# Patient Record
Sex: Male | Born: 1995 | Race: Black or African American | Hispanic: No | Marital: Single | Smoking: Never smoker
Health system: Southern US, Community
[De-identification: ages and names within clinical notes are randomized; demographics above are authoritative.]

## PROBLEM LIST (undated history)

## (undated) DIAGNOSIS — R220 Localized swelling, mass and lump, head: Secondary | ICD-10-CM

## (undated) DIAGNOSIS — E785 Hyperlipidemia, unspecified: Secondary | ICD-10-CM

## (undated) DIAGNOSIS — F988 Other specified behavioral and emotional disorders with onset usually occurring in childhood and adolescence: Secondary | ICD-10-CM

## (undated) DIAGNOSIS — F99 Mental disorder, not otherwise specified: Secondary | ICD-10-CM

## (undated) DIAGNOSIS — E119 Type 2 diabetes mellitus without complications: Secondary | ICD-10-CM

## (undated) DIAGNOSIS — I10 Essential (primary) hypertension: Secondary | ICD-10-CM

## (undated) DIAGNOSIS — M1990 Unspecified osteoarthritis, unspecified site: Secondary | ICD-10-CM

## (undated) DIAGNOSIS — R569 Unspecified convulsions: Secondary | ICD-10-CM

## (undated) DIAGNOSIS — F909 Attention-deficit hyperactivity disorder, unspecified type: Secondary | ICD-10-CM

## (undated) DIAGNOSIS — J45909 Unspecified asthma, uncomplicated: Secondary | ICD-10-CM

---
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
## 2015-08-30 ENCOUNTER — Other Ambulatory Visit: Payer: Self-pay | Admitting: Sports Medicine

## 2015-08-30 DIAGNOSIS — M25562 Pain in left knee: Secondary | ICD-10-CM

## 2015-09-03 ENCOUNTER — Ambulatory Visit
Admission: RE | Admit: 2015-09-03 | Discharge: 2015-09-03 | Disposition: A | Discharge status: Home / Self Care | Payer: Medicaid Other | Source: Ambulatory Visit | Attending: Sports Medicine | Admitting: Sports Medicine

## 2015-09-03 DIAGNOSIS — M25562 Pain in left knee: Secondary | ICD-10-CM

## 2017-02-15 ENCOUNTER — Ambulatory Visit (HOSPITAL_COMMUNITY)
Admission: EM | Admit: 2017-02-15 | Discharge: 2017-02-15 | Disposition: A | Discharge status: Home / Self Care | Payer: Medicaid Other | Attending: Family Medicine | Admitting: Family Medicine

## 2017-02-15 ENCOUNTER — Encounter (HOSPITAL_COMMUNITY): Payer: Self-pay | Admitting: Emergency Medicine

## 2017-02-15 DIAGNOSIS — S39012A Strain of muscle, fascia and tendon of lower back, initial encounter: Secondary | ICD-10-CM | POA: Diagnosis not present

## 2017-02-15 DIAGNOSIS — K0889 Other specified disorders of teeth and supporting structures: Secondary | ICD-10-CM

## 2017-02-15 MED ORDER — AMOXICILLIN 875 MG PO TABS: 875.0000 mg | ORAL_TABLET | Freq: Two times a day (BID) | ORAL | 0 refills | Status: DC

## 2017-02-15 MED ORDER — CYCLOBENZAPRINE HCL 5 MG PO TABS: 5.0000 mg | ORAL_TABLET | Freq: Every day | ORAL | 0 refills | Status: DC

## 2017-02-15 MED ORDER — DICLOFENAC SODIUM 75 MG PO TBEC: 75.0000 mg | DELAYED_RELEASE_TABLET | Freq: Two times a day (BID) | ORAL | 0 refills | Status: DC

## 2017-02-15 NOTE — ED Provider Notes (Signed)
[facility]   [id number] 02/15/17 Arrival Time: 1215   SUBJECTIVE:  Mason Pratt is a 21 y.o. male who presents to the urgent care with complaint of dental pain two weeks after extraction of front two teeth.  Also c/o chronic low back pain.  Mother wants him to have oxycodone.     Past Medical History:  Diagnosis Date  . Diabetes mellitus without complication (HCC)   . Hyperlipidemia   . Hypertension    History reviewed. No pertinent family history. Social History   Socioeconomic History  . Marital status: Single    Spouse name: Not on file  . Number of children: Not on file  . Years of education: Not on file  . Highest education level: Not on file  Social Needs  . Financial resource strain: Not on file  . Food insecurity - worry: Not on file  . Food insecurity - inability: Not on file  . Transportation needs - medical: Not on file  . Transportation needs - non-medical: Not on file  Occupational History  . Not on file  Tobacco Use  . Smoking status: Never Smoker  Substance and Sexual Activity  . Alcohol use: No  . Drug use: Not on file  . Sexual activity: Not on file  Other Topics Concern  . Not on file  Social History Narrative  . Not on file   No outpatient medications have been marked as taking for the 02/15/17 encounter [facility](Hospital Encounter).   No Known Allergies    ROS: As per HPI, remainder of ROS negative.   OBJECTIVE:   Vitals:   02/15/17 1256  BP: (!) 196/101  Pulse: 87  Resp: 18  Temp: 99.1 F (37.3 C)  TempSrc: Oral  SpO2: 99%     General appearance: alert; no distress Eyes: PERRL; EOMI; conjunctiva normal HENT: normocephalic; atraumatic; TMs normal, canal normal, external ears normal without trauma; nasal mucosa normal; oral mucosa normal, missing front upper two teeth Neck: supple Back: no CVA tenderness, mild midline tenderness Extremities: no cyanosis or edema; symmetrical with no gross deformities Skin: warm  and dry Neurologic: normal gait; grossly normal Psychological: alert and cooperative; normal mood and affect      Labs:  Results for orders placed or performed during the hospital encounter of 02/23/09  CK total and CKMB  Result Value Ref Range   Total CK 185 7 - 232 U/L   CK, MB 2.4 0.3 - 4.0 ng/mL   Relative Index 1.3 0.0 - 2.5  D-dimer, quantitative  Result Value Ref Range   D-Dimer, Quant  0.00 - 0.48 ug/mL-FEU    0.23        AT THE INHOUSE ESTABLISHED CUTOFF VALUE OF 0.48 ug/mL FEU, THIS ASSAY HAS BEEN DOCUMENTED IN THE LITERATURE TO HAVE A SENSITIVITY AND NEGATIVE PREDICTIVE VALUE OF AT LEAST 98 TO 99%.  THE TEST RESULT SHOULD BE CORRELATED WITH AN ASSESSMENT OF THE CLINICAL PROBABILITY OF DVT / VTE.  Troponin I  Result Value Ref Range   Troponin I 0.01        NO INDICATION OF MYOCARDIAL INJURY. 0.00 - 0.06 ng/mL    Labs Reviewed - No data to display  No results found.     ASSESSMENT & PLAN:  1. Pain, dental   2. Lumbar strain, initial encounter     Meds ordered this encounter  Medications  . diclofenac (VOLTAREN) 75 MG EC tablet    Sig: Take 1 tablet (75 mg total) by mouth  2 (two) times daily.    Dispense:  14 tablet    Refill:  0  . cyclobenzaprine (FLEXERIL) 5 MG tablet    Sig: Take 1 tablet (5 mg total) by mouth at bedtime.    Dispense:  7 tablet    Refill:  0  . amoxicillin (AMOXIL) 875 MG tablet    Sig: Take 1 tablet (875 mg total) by mouth 2 (two) times daily.    Dispense:  20 tablet    Refill:  0    Reviewed expectations re: course of current medical issues. Questions answered. Outlined signs and symptoms indicating need for more acute intervention. Patient verbalized understanding. After Visit Summary given.    Procedures:      Elvina SidleLauenstein, Yailin Biederman, MD 02/15/17 1319

## 2017-02-15 NOTE — Discharge Instructions (Signed)
We are not allowed to prescribe oxycodone for these types of pain.  If the prescribed medicine is not working, call your dentist.  Mason Pratt has resume prep [facility]th [facility] [employer] [facility] 9 am on January 11

## 2017-02-15 NOTE — ED Triage Notes (Signed)
Pt here with mother c/o dental pain where had tooth removed

## 2017-04-09 ENCOUNTER — Other Ambulatory Visit: Payer: Self-pay | Admitting: Sports Medicine

## 2017-04-09 DIAGNOSIS — M545 Low back pain: Secondary | ICD-10-CM

## 2017-04-13 ENCOUNTER — Encounter (HOSPITAL_COMMUNITY): Payer: Self-pay | Admitting: Emergency Medicine

## 2017-04-13 ENCOUNTER — Emergency Department (HOSPITAL_COMMUNITY)
Admission: EM | Admit: 2017-04-13 | Discharge: 2017-04-13 | Disposition: A | Discharge status: Home / Self Care | Payer: Medicaid Other | Attending: Emergency Medicine | Admitting: Emergency Medicine

## 2017-04-13 DIAGNOSIS — I10 Essential (primary) hypertension: Secondary | ICD-10-CM | POA: Insufficient documentation

## 2017-04-13 DIAGNOSIS — R1110 Vomiting, unspecified: Secondary | ICD-10-CM | POA: Diagnosis not present

## 2017-04-13 DIAGNOSIS — R739 Hyperglycemia, unspecified: Secondary | ICD-10-CM

## 2017-04-13 DIAGNOSIS — Z794 Long term (current) use of insulin: Secondary | ICD-10-CM | POA: Insufficient documentation

## 2017-04-13 DIAGNOSIS — E1165 Type 2 diabetes mellitus with hyperglycemia: Secondary | ICD-10-CM | POA: Insufficient documentation

## 2017-04-13 DIAGNOSIS — Z79899 Other long term (current) drug therapy: Secondary | ICD-10-CM | POA: Insufficient documentation

## 2017-04-13 DIAGNOSIS — M6281 Muscle weakness (generalized): Secondary | ICD-10-CM | POA: Diagnosis present

## 2017-04-13 LAB — URINALYSIS, ROUTINE W REFLEX MICROSCOPIC
Bacteria, UA: NONE SEEN
Bilirubin Urine: NEGATIVE
Hgb urine dipstick: NEGATIVE
Ketones, ur: 20 mg/dL — AB
Leukocytes, UA: NEGATIVE
Nitrite: NEGATIVE
PH: 5 (ref 5.0–8.0)
Protein, ur: NEGATIVE mg/dL
SPECIFIC GRAVITY, URINE: 1.025 (ref 1.005–1.030)

## 2017-04-13 LAB — CBC WITH DIFFERENTIAL/PLATELET
BASOS ABS: 0 10*3/uL (ref 0.0–0.1)
Basophils Relative: 0 %
EOS PCT: 3 %
Eosinophils Absolute: 0.2 10*3/uL (ref 0.0–0.7)
HCT: 47.4 % (ref 39.0–52.0)
Hemoglobin: 17.2 g/dL — ABNORMAL HIGH (ref 13.0–17.0)
LYMPHS ABS: 1.8 10*3/uL (ref 0.7–4.0)
LYMPHS PCT: 26 %
MCH: 29.6 pg (ref 26.0–34.0)
MCHC: 36.3 g/dL — AB (ref 30.0–36.0)
MCV: 81.6 fL (ref 78.0–100.0)
MONO ABS: 0.4 10*3/uL (ref 0.1–1.0)
MONOS PCT: 5 %
Neutro Abs: 4.6 10*3/uL (ref 1.7–7.7)
Neutrophils Relative %: 66 %
PLATELETS: 172 10*3/uL (ref 150–400)
RBC: 5.81 MIL/uL (ref 4.22–5.81)
RDW: 13.9 % (ref 11.5–15.5)
WBC: 7 10*3/uL (ref 4.0–10.5)

## 2017-04-13 LAB — COMPREHENSIVE METABOLIC PANEL
ALT: 18 U/L (ref 17–63)
AST: 16 U/L (ref 15–41)
Albumin: 4.6 g/dL (ref 3.5–5.0)
Alkaline Phosphatase: 118 U/L (ref 38–126)
Anion gap: 16 — ABNORMAL HIGH (ref 5–15)
BILIRUBIN TOTAL: 1.4 mg/dL — AB (ref 0.3–1.2)
BUN: 18 mg/dL (ref 6–20)
CHLORIDE: 93 mmol/L — AB (ref 101–111)
CO2: 19 mmol/L — ABNORMAL LOW (ref 22–32)
CREATININE: 1.29 mg/dL — AB (ref 0.61–1.24)
Calcium: 9.8 mg/dL (ref 8.9–10.3)
GFR calc Af Amer: >60 mL/min (ref >60)
Glucose, Bld: 542 mg/dL (ref 65–99)
Potassium: 4.8 mmol/L (ref 3.5–5.1)
Sodium: 128 mmol/L — ABNORMAL LOW (ref 135–145)
TOTAL PROTEIN: 8.7 g/dL — AB (ref 6.5–8.1)

## 2017-04-13 LAB — BLOOD GAS, VENOUS
Acid-base deficit: 5.2 mmol/L — ABNORMAL HIGH (ref 0.0–2.0)
BICARBONATE: 21.6 mmol/L (ref 20.0–28.0)
O2 SAT: 45.5 %
PATIENT TEMPERATURE: 98.6
PCO2 VEN: 47.6 mmHg (ref 44.0–60.0)
pH, Ven: 7.279 (ref 7.250–7.430)

## 2017-04-13 LAB — CBG MONITORING, ED
GLUCOSE-CAPILLARY: 397 mg/dL — AB (ref 65–99)
Glucose-Capillary: 581 mg/dL (ref 65–99)

## 2017-04-13 LAB — LIPASE, BLOOD: LIPASE: 24 U/L (ref 11–51)

## 2017-04-13 MED ORDER — METFORMIN HCL 500 MG PO TABS: 500.0000 mg | ORAL_TABLET | Freq: Two times a day (BID) | ORAL | 0 refills | Status: DC

## 2017-04-13 MED ORDER — INSULIN ASPART 100 UNIT/ML ~~LOC~~ SOLN
10.0000 [IU] | Freq: Once | SUBCUTANEOUS | Status: AC
  Administered 2017-04-13: 10 [IU] via SUBCUTANEOUS
  Filled 2017-04-13: qty 1

## 2017-04-13 MED ORDER — ACCU-CHEK MULTICLIX LANCETS MISC: 12 refills | Status: DC

## 2017-04-13 MED ORDER — SODIUM CHLORIDE 0.9 % IV SOLN
INTRAVENOUS | Status: DC
  Administered 2017-04-13: 11:00:00 via INTRAVENOUS

## 2017-04-13 MED ORDER — INSULIN GLARGINE 100 UNIT/ML ~~LOC~~ SOLN: 35.0000 [IU] | Freq: Every day | SUBCUTANEOUS | 11 refills | Status: DC

## 2017-04-13 MED ORDER — OXYCODONE-ACETAMINOPHEN 5-325 MG PO TABS
2.0000 | ORAL_TABLET | Freq: Once | ORAL | Status: AC
  Administered 2017-04-13: 2 via ORAL
  Filled 2017-04-13: qty 2

## 2017-04-13 MED ORDER — SODIUM CHLORIDE 0.9 % IV BOLUS (SEPSIS)
1000.0000 mL | Freq: Once | INTRAVENOUS | Status: AC
  Administered 2017-04-13: 1000 mL via INTRAVENOUS

## 2017-04-13 NOTE — ED Triage Notes (Signed)
Patient presents with mother stating he has been throwing up for past week and having lower back muscle spasms. Pt denies any urinary symptoms.

## 2017-04-13 NOTE — ED Notes (Signed)
MD notified about patient CBG [phone]

## 2017-04-13 NOTE — ED Provider Notes (Signed)
[facility]-EMERGENCY DEPT Provider Note   CSN: [id number] Arrival date & time: 04/13/17  0907     History   Chief Complaint Chief Complaint  Patient presents with  . Emesis  . Back Pain    HPI Mason Pratt is a 22 y.o. male.  22 year old male with history of type 2 diabetes who has been noncompliant with his diabetic medications times 1 year presents with several weeks of worsening polyuria as well as polydipsia.  Patient currently notes increased lethargy and weakness.  No fever or chills.  No bilious emesis or diarrhea.  Has had worsening chronic back pain.  No neurological findings.  No treatment used prior to arrival nothing makes his symptoms better or worse.      Past Medical History:  Diagnosis Date  . Diabetes mellitus without complication (HCC)   . Hyperlipidemia   . Hypertension     There are no active problems to display for this patient.   History reviewed. No pertinent surgical history.     Home Medications    Prior to Admission medications   Medication Sig Start Date End Date Taking? Authorizing Provider  amoxicillin (AMOXIL) 875 MG tablet Take 1 tablet (875 mg total) by mouth 2 (two) times daily. 02/15/17   Elvina SidleLauenstein, Kurt, MD  cyclobenzaprine (FLEXERIL) 5 MG tablet Take 1 tablet (5 mg total) by mouth at bedtime. 02/15/17   Elvina SidleLauenstein, Kurt, MD  diclofenac (VOLTAREN) 75 MG EC tablet Take 1 tablet (75 mg total) by mouth 2 (two) times daily. 02/15/17   Elvina SidleLauenstein, Kurt, MD  insulin glargine (LANTUS) 100 UNIT/ML injection Inject 35 Units into the skin daily.    [provider]  lisinopril (PRINIVIL,ZESTRIL) 30 MG tablet Take 30 mg by mouth daily. 11/11/14   [provider]  losartan (COZAAR) 25 MG tablet Take 25 mg by mouth at bedtime. 10/11/14   [provider]  metFORMIN (GLUCOPHAGE-XR) 750 MG 24 hr tablet 750 mg 2 (two) times daily. Takes twice daily-  After dinner    [provider]  NOVOLOG  FLEXPEN 100 UNIT/ML FlexPen Up to 40 units in total daily. Typically 10 units with each meal. Can go up to 4 meals daily 11/13/14   [provider]  pravastatin (PRAVACHOL) 20 MG tablet Take 20 mg by mouth daily.    [provider]  PROAIR HFA 108 (90 BASE) MCG/ACT inhaler  11/11/14   [provider]    Family History No family history on file.  Social History Social History   Tobacco Use  . Smoking status: Never Smoker  Substance Use Topics  . Alcohol use: No  . Drug use: Not on file     Allergies   Patient has no known allergies.   Review of Systems Review of Systems  All other systems reviewed and are negative.    Physical Exam Updated Vital Signs BP (!) 153/112 (BP Location: Left Arm)   Pulse (!) 103   Temp 98.2 F (36.8 C) (Oral)   Resp 18   Ht 2.032 m (6\' 8" )   Wt (!) 190.5 kg (420 lb)   SpO2 98%   BMI 46.14 kg/m   Physical Exam  Constitutional: He is oriented to person, place, and time. He appears well-developed and well-nourished.  Non-toxic appearance. No distress.  HENT:  Head: Normocephalic and atraumatic.  Eyes: Conjunctivae, EOM and lids are normal. Pupils are equal, round, and reactive to light.  Neck: Normal range of motion. Neck supple. No  tracheal deviation present. No thyroid mass present.  Cardiovascular: Regular rhythm and normal heart sounds. Tachycardia present. Exam reveals no gallop.  No murmur heard. Pulmonary/Chest: Effort normal and breath sounds normal. No stridor. No respiratory distress. He has no decreased breath sounds. He has no wheezes. He has no rhonchi. He has no rales.  Abdominal: Soft. Normal appearance and bowel sounds are normal. He exhibits no distension. There is no tenderness. There is no rebound and no CVA tenderness.  Musculoskeletal: Normal range of motion. He exhibits no edema or tenderness.  Neurological: He is alert and oriented to person, place, and time. He has normal strength. No cranial  nerve deficit or sensory deficit. GCS eye subscore is 4. GCS verbal subscore is 5. GCS motor subscore is 6.  Skin: Skin is warm and dry. No abrasion and no rash noted.  Psychiatric: He has a normal mood and affect. His speech is normal and behavior is normal.  Nursing note and vitals reviewed.    ED Treatments / Results  Labs (all labs ordered are listed, but only abnormal results are displayed) Labs Reviewed  CBG MONITORING, ED - Abnormal; Notable for the following components:      Result Value   Glucose-Capillary 581 (*)    All other components within normal limits  URINE CULTURE  CBC WITH DIFFERENTIAL/PLATELET  URINALYSIS, ROUTINE W REFLEX MICROSCOPIC  BLOOD GAS, VENOUS  COMPREHENSIVE METABOLIC PANEL  LIPASE, BLOOD    EKG  EKG Interpretation None       Radiology No results found.  Procedures Procedures (including critical care time)  Medications Ordered in ED Medications  sodium chloride 0.9 % bolus 1,000 mL (not administered)  0.9 %  sodium chloride infusion (not administered)     Initial Impression / Assessment and Plan / ED Course  I have reviewed the triage vital signs and the nursing notes.  Pertinent labs & imaging results that were available during my care of the patient were reviewed by me and considered in my medical decision making (see chart for details).     Patient given IV fluids and insulin here for his hyperglycemia.  No evidence of DKA.  While the patient was here, his mother gave him a 32 ounce cup of regular full suite and lemonade.  Will prescribe metformin and insulin per his prior regimen.  Final Clinical Impressions(s) / ED Diagnoses   Final diagnoses:  None    ED Discharge Orders    None       Lorre Nick, MD 04/13/17 1408

## 2017-04-13 NOTE — ED Notes (Signed)
CArb modified tray for patient and soup with crackers given to mother of patient.

## 2017-04-13 NOTE — ED Notes (Signed)
Bed: WA05 Expected date:  Expected time:  Means of arrival:  Comments: 

## 2017-04-14 LAB — URINE CULTURE: Culture: <10000 — AB

## 2017-04-17 ENCOUNTER — Ambulatory Visit
Admission: RE | Admit: 2017-04-17 | Discharge: 2017-04-17 | Disposition: A | Discharge status: Home / Self Care | Payer: Medicaid Other | Source: Ambulatory Visit | Attending: Sports Medicine | Admitting: Sports Medicine

## 2017-04-17 DIAGNOSIS — M545 Low back pain: Secondary | ICD-10-CM

## 2017-06-02 ENCOUNTER — Other Ambulatory Visit (HOSPITAL_COMMUNITY): Payer: Self-pay | Admitting: Physical Medicine and Rehabilitation

## 2017-06-02 DIAGNOSIS — M545 Low back pain: Secondary | ICD-10-CM

## 2017-06-03 ENCOUNTER — Telehealth (HOSPITAL_COMMUNITY): Payer: Self-pay

## 2017-06-03 NOTE — Telephone Encounter (Signed)
Spoke to pt and his mother. They would like to wait a couple weeks before scheduling. He does not feel comfortable going through with procedure. AW

## 2017-06-08 ENCOUNTER — Encounter (HOSPITAL_COMMUNITY): Payer: Self-pay

## 2017-06-08 ENCOUNTER — Ambulatory Visit (HOSPITAL_COMMUNITY): Payer: Medicaid Other

## 2017-06-29 ENCOUNTER — Emergency Department (HOSPITAL_COMMUNITY)
Admission: EM | Admit: 2017-06-29 | Discharge: 2017-06-29 | Disposition: A | Discharge status: Home / Self Care | Payer: Medicaid Other | Attending: Emergency Medicine | Admitting: Emergency Medicine

## 2017-06-29 ENCOUNTER — Other Ambulatory Visit: Payer: Self-pay

## 2017-06-29 ENCOUNTER — Encounter (HOSPITAL_COMMUNITY): Payer: Self-pay

## 2017-06-29 DIAGNOSIS — I10 Essential (primary) hypertension: Secondary | ICD-10-CM | POA: Insufficient documentation

## 2017-06-29 DIAGNOSIS — Z7984 Long term (current) use of oral hypoglycemic drugs: Secondary | ICD-10-CM | POA: Diagnosis not present

## 2017-06-29 DIAGNOSIS — Z79899 Other long term (current) drug therapy: Secondary | ICD-10-CM | POA: Diagnosis not present

## 2017-06-29 DIAGNOSIS — E119 Type 2 diabetes mellitus without complications: Secondary | ICD-10-CM | POA: Diagnosis not present

## 2017-06-29 DIAGNOSIS — R221 Localized swelling, mass and lump, neck: Secondary | ICD-10-CM | POA: Diagnosis present

## 2017-06-29 DIAGNOSIS — R591 Generalized enlarged lymph nodes: Secondary | ICD-10-CM | POA: Insufficient documentation

## 2017-06-29 NOTE — ED Provider Notes (Signed)
[facility]-EMERGENCY DEPT Provider Note   CSN: [id number] Arrival date & time: 06/29/17  0805     History   Chief Complaint Chief Complaint  Patient presents with  . raised area on neck    HPI Mason Pratt is a 22 y.o. male.  HPI Patient complains of a small lump on the left side of his neck that seems to come and go.  This has been off and on for many years.  When it has enlarged it is sore and tender to the touch.  Currently it is not swollen and is not particularly tender.  Denies any difficulty with a sore throat.  No difficulty swallowing or breathing.  He denies any fevers or chills.  No earache.  No chest pain.  No other complaints. Past Medical History:  Diagnosis Date  . Diabetes mellitus without complication (HCC)   . Hyperlipidemia   . Hypertension     There are no active problems to display for this patient.   History reviewed. No pertinent surgical history.      Home Medications    Prior to Admission medications   Medication Sig Start Date End Date Taking? Authorizing Provider  amoxicillin (AMOXIL) 875 MG tablet Take 1 tablet (875 mg total) by mouth 2 (two) times daily. Patient not taking: Reported on 04/13/2017 02/15/17   Elvina Sidle, MD  cyclobenzaprine (FLEXERIL) 10 MG tablet Take 10 mg by mouth 3 (three) times daily as needed for muscle spasms.    [provider]  cyclobenzaprine (FLEXERIL) 5 MG tablet Take 1 tablet (5 mg total) by mouth at bedtime. Patient not taking: Reported on 04/13/2017 02/15/17   Elvina Sidle, MD  diclofenac (VOLTAREN) 75 MG EC tablet Take 1 tablet (75 mg total) by mouth 2 (two) times daily. Patient not taking: Reported on 04/13/2017 02/15/17   Elvina Sidle, MD  gabapentin (NEURONTIN) 100 MG capsule Take 100 mg by mouth 4 (four) times daily.    [provider]  insulin glargine (LANTUS) 100 UNIT/ML injection Inject 0.35 mLs (35 Units total) into the skin at bedtime. 04/13/17    Lorre Nick, MD  Lancets (ACCU-CHEK MULTICLIX) lancets Use as instructed 04/13/17   Lorre Nick, MD  metFORMIN (GLUCOPHAGE) 500 MG tablet Take 1 tablet (500 mg total) by mouth 2 (two) times daily with a meal. 04/13/17   Lorre Nick, MD  PROAIR HFA 108 (90 BASE) MCG/ACT inhaler  11/11/14   [provider]  traMADol (ULTRAM) 50 MG tablet Take 50 mg by mouth 2 (two) times daily as needed for moderate pain.  04/10/17   [provider]    Family History History reviewed. No pertinent family history.  Social History Social History   Tobacco Use  . Smoking status: Never Smoker  . Smokeless tobacco: Never Used  Substance Use Topics  . Alcohol use: No  . Drug use: Never     Allergies   Patient has no known allergies.   Review of Systems Review of Systems  All other systems reviewed and are negative.    Physical Exam Updated Vital Signs BP (!) 165/98 (BP Location: Right Arm)   Pulse (!) 102   Temp (!) 97.5 F (36.4 C) (Oral)   Resp 18   Ht 2.032 m (6\' 8" )   Wt (!) 164.2 kg (362 lb)   SpO2 96%   BMI 39.77 kg/m   Physical Exam  Constitutional: He appears well-developed and well-nourished. No distress.  HENT:  Head: Normocephalic and  atraumatic.  Right Ear: External ear normal.  Left Ear: External ear normal.  Mouth/Throat: No oropharyngeal exudate.  Eyes: Conjunctivae are normal. Right eye exhibits no discharge. Left eye exhibits no discharge. No scleral icterus.  Neck: Normal range of motion. Neck supple. No JVD present. No tracheal deviation present. No thyromegaly present.  Cardiovascular: Normal rate.  Pulmonary/Chest: Effort normal. No stridor. No respiratory distress.  Abdominal: He exhibits no distension.  Musculoskeletal: He exhibits no edema.  Lymphadenopathy:    He has no cervical adenopathy.  Neurological: He is alert. Cranial nerve deficit: no gross deficits.  Skin: Skin is warm and dry. No rash noted.  Psychiatric: He has a normal  mood and affect.  Nursing note and vitals reviewed.    ED Treatments / Results  Procedures Procedures (including critical care time)  Medications Ordered in ED Medications - No data to display   Initial Impression / Assessment and Plan / ED Course  I have reviewed the triage vital signs and the nursing notes.  Pertinent labs & imaging results that were available during my care of the patient were reviewed by me and considered in my medical decision making (see chart for details).   Patient is here to be seen along with his mother.  He mentions having a sore lump on his throat off and on for many years.  I suspect the patient is referring to a small lymph node.  Currently there is no evidence of swelling.  I do notice that he has some minor folliculitis associated with his beard.  I mentioned to him that this could be the cause of having some intermittent lymph node swelling.  At this time there does not appear to be any evidence of an acute emergency medical condition and the patient appears stable for discharge with appropriate outpatient follow up.   Final Clinical Impressions(s) / ED Diagnoses   Final diagnoses:  Lymphadenopathy    ED Discharge Orders    None       Linwood DibblesKnapp, Zunaira Lamy, MD 06/29/17 [phone]

## 2017-06-29 NOTE — Discharge Instructions (Addendum)
Follow-up with primary care doctor as needed, monitor for fever, sore throat, worsening symptoms

## 2017-06-29 NOTE — ED Notes (Signed)
Bed: WTR8 Expected date:  Expected time:  Means of arrival:  Comments: 

## 2017-06-29 NOTE — ED Triage Notes (Signed)
Patient states, "I have had a knot on the left side of my neck since I was 14. It comes and goes and it is sore."

## 2018-02-17 ENCOUNTER — Emergency Department (HOSPITAL_COMMUNITY)
Admission: EM | Admit: 2018-02-17 | Discharge: 2018-02-17 | Disposition: A | Discharge status: Home / Self Care | Payer: Medicaid Other | Attending: Emergency Medicine | Admitting: Emergency Medicine

## 2018-02-17 ENCOUNTER — Encounter (HOSPITAL_COMMUNITY): Payer: Self-pay | Admitting: *Deleted

## 2018-02-17 DIAGNOSIS — I10 Essential (primary) hypertension: Secondary | ICD-10-CM | POA: Insufficient documentation

## 2018-02-17 DIAGNOSIS — Z794 Long term (current) use of insulin: Secondary | ICD-10-CM | POA: Insufficient documentation

## 2018-02-17 DIAGNOSIS — E119 Type 2 diabetes mellitus without complications: Secondary | ICD-10-CM | POA: Insufficient documentation

## 2018-02-17 DIAGNOSIS — Z79899 Other long term (current) drug therapy: Secondary | ICD-10-CM | POA: Diagnosis not present

## 2018-02-17 DIAGNOSIS — K0889 Other specified disorders of teeth and supporting structures: Secondary | ICD-10-CM | POA: Insufficient documentation

## 2018-02-17 MED ORDER — ACETAMINOPHEN 500 MG PO TABS
1000.0000 mg | ORAL_TABLET | Freq: Once | ORAL | Status: AC
  Administered 2018-02-17: 1000 mg via ORAL
  Filled 2018-02-17: qty 2

## 2018-02-17 MED ORDER — AMOXICILLIN-POT CLAVULANATE 875-125 MG PO TABS: 1.0000 | ORAL_TABLET | Freq: Two times a day (BID) | ORAL | 0 refills | Status: DC

## 2018-02-17 NOTE — ED Provider Notes (Signed)
[facility]-EMERGENCY DEPT Provider Note   CSN: [id number] Arrival date & time: 02/17/18  1349     History   Chief Complaint Chief Complaint  Patient presents with  . Dental Pain    HPI Mason Pratt is a 22 y.o. male with PMH/o of DM, HLD, HTN who presents for evaluation of 1.5 weeks of right upper dental pain. Patient reports being seen by a dentist 2 days ago and was diagnosed with periodontitis. He was discharged home with mouthwash which he states he has been using. Family reports that he has continued to have pain to the right upper side. He has not taken any medication for pain. Patient states he feels like the right side of his face is swollen. He has not noted any overlying warmth or erythema. Mom and patient deny any fevers.   The history is provided by the patient and a relative.    Past Medical History:  Diagnosis Date  . Diabetes mellitus without complication (HCC)   . Hyperlipidemia   . Hypertension     There are no active problems to display for this patient.   History reviewed. No pertinent surgical history.      Home Medications    Prior to Admission medications   Medication Sig Start Date End Date Taking? Authorizing Provider  amoxicillin (AMOXIL) 875 MG tablet Take 1 tablet (875 mg total) by mouth 2 (two) times daily. Patient not taking: Reported on 04/13/2017 02/15/17   Elvina Sidle, MD  amoxicillin-clavulanate (AUGMENTIN) 875-125 MG tablet Take 1 tablet by mouth every 12 (twelve) hours. 02/17/18   Maxwell Caul, PA-C  cyclobenzaprine (FLEXERIL) 10 MG tablet Take 10 mg by mouth 3 (three) times daily as needed for muscle spasms.    [provider]  cyclobenzaprine (FLEXERIL) 5 MG tablet Take 1 tablet (5 mg total) by mouth at bedtime. Patient not taking: Reported on 04/13/2017 02/15/17   Elvina Sidle, MD  diclofenac (VOLTAREN) 75 MG EC tablet Take 1 tablet (75 mg total) by mouth 2 (two) times daily. Patient not  taking: Reported on 04/13/2017 02/15/17   Elvina Sidle, MD  gabapentin (NEURONTIN) 100 MG capsule Take 100 mg by mouth 4 (four) times daily.    [provider]  insulin glargine (LANTUS) 100 UNIT/ML injection Inject 0.35 mLs (35 Units total) into the skin at bedtime. 04/13/17   Lorre Nick, MD  Lancets (ACCU-CHEK MULTICLIX) lancets Use as instructed 04/13/17   Lorre Nick, MD  metFORMIN (GLUCOPHAGE) 500 MG tablet Take 1 tablet (500 mg total) by mouth 2 (two) times daily with a meal. 04/13/17   Lorre Nick, MD  PROAIR HFA 108 (90 BASE) MCG/ACT inhaler  11/11/14   [provider]  traMADol (ULTRAM) 50 MG tablet Take 50 mg by mouth 2 (two) times daily as needed for moderate pain.  04/10/17   [provider]    Family History No family history on file.  Social History Social History   Tobacco Use  . Smoking status: Never Smoker  . Smokeless tobacco: Never Used  Substance Use Topics  . Alcohol use: No  . Drug use: Never     Allergies   Patient has no known allergies.   Review of Systems Review of Systems  Constitutional: Negative for fever.  HENT: Positive for dental problem and facial swelling.   Skin: Negative for color change.  All other systems reviewed and are negative.    Physical Exam Updated Vital Signs BP (!) 153/100 (BP  Location: Left Arm)   Pulse 75   Temp 98 F (36.7 C) (Oral)   Resp 18   SpO2 99%   Physical Exam  Constitutional: He appears well-developed and well-nourished.  HENT:  Head: Normocephalic and atraumatic.  Mouth/Throat: Uvula is midline, oropharynx is clear and moist and mucous membranes are normal. No trismus in the jaw.  Airway is patent, phonation is intact. No oral angioedema.  Patient able to open his mouth for full examination.  He has tenderness palpation noted to tooth #31.  He additionally has an erupted wisdom tooth.  No gingival edema.  There is mild gingival irritation surrounding that tooth.  No  fluctuance.  No obvious abscess.  Uvula is midline.  Face is symmetric in appearance without any overlying warmth, erythema, edema.  Eyes: Conjunctivae and EOM are normal. Right eye exhibits no discharge. Left eye exhibits no discharge. No scleral icterus.  Pulmonary/Chest: Effort normal.  Neurological: He is alert.  Skin: Skin is warm and dry.  Psychiatric: He has a normal mood and affect. His speech is normal and behavior is normal.  Nursing note and vitals reviewed.    ED Treatments / Results  Labs (all labs ordered are listed, but only abnormal results are displayed) Labs Reviewed - No data to display  EKG None  Radiology No results found.  Procedures Procedures (including critical care time)  Medications Ordered in ED Medications  acetaminophen (TYLENOL) tablet 1,000 mg (1,000 mg Oral Given 02/17/18 1434)     Initial Impression / Assessment and Plan / ED Course  I have reviewed the triage vital signs and the nursing notes.  Pertinent labs & imaging results that were available during my care of the patient were reviewed by me and considered in my medical decision making (see chart for details).     22 y.o. M presents with 1.5 weeks of dental pain.  Seen by dentist 2 days ago and diagnosed with periodontitis and prescribed mouthwash.  Comes today because he is continued to have pain.  Not taking any medications for pain.  On exam, he has tenderness to palpation noted to tooth #31.  He also has an erupting wisdom tooth, which could be contributing to his symptoms..  There is some mild gingival erythema but no edema.  No fluctuance.  No palpable abscess noted.  Patient reports that hurts to open his mouth but he has no evidence of trismus.  He is able to open the mouth at least 3 fingers width.  No evidence of abscess requiring immediate incision and drainage. Exam not concerning for Ludwig's angina or pharyngeal/peritonsillar abscess. Will treat with course of antibiotics given  patient's history of diabetes and continued pain. Patient instructed to follow-up with dentist referral provided. Stable for discharge at this time. Strict return precautions discussed. Patient expresses understanding and agreement to plan.  Patient reviewed on PMP.  Patient with extensive history of tramadol and narcotic prescriptions.  He had 5 narcotic prescription the last year and 2 tramadol prescriptions.  Portions of this note were generated with Scientist, clinical (histocompatibility and immunogenetics)Dragon dictation software. Dictation errors may occur despite best attempts at proofreading.  Final Clinical Impressions(s) / ED Diagnoses   Final diagnoses:  Pain, dental    ED Discharge Orders         Ordered    amoxicillin-clavulanate (AUGMENTIN) 875-125 MG tablet  Every 12 hours     02/17/18 1432           Maxwell CaulLayden, Jarielys Girardot A, PA-C 02/17/18 1540  Arby Barrette, MD 02/18/18 1231

## 2018-02-17 NOTE — ED Triage Notes (Signed)
Pt complains of right upper dental pain. Pt went to dentist and was told he had periodontitis and given rinse to use. Pt states his symptoms.

## 2018-02-17 NOTE — Discharge Instructions (Signed)
Take antibiotics as directed. Please take all of your antibiotics until finished.  You can take Tylenol or Ibuprofen as directed for pain. You can alternate Tylenol and Ibuprofen every 4 hours. If you take Tylenol at 1pm, then you can take Ibuprofen at 5pm. Then you can take Tylenol again at 9pm.   The exam and treatment you received today has been provided on an emergency basis only. This is not a substitute for complete medical or dental care. If your problem worsens or new symptoms (problems) appear, and you are unable to arrange prompt follow-up care with your dentist, call or return to this location. If you do not have a dentist, please follow-up with one on the list provided  CALL YOUR DENTIST OR RETURN IMMEDIATELY IF you develop a fever, rash, difficulty breathing or swallowing, neck or facial swelling, or other potentially serious concerns.   Please follow-up with one of the dental clinics provided to you below or in your paperwork. Call and tell them you were seen in the Emergency Dept and arrange for an appointment. You may have to call multiple places in order to find a place to be seen.  Dental Assistance If the dentist on-call cannot see you, please use the resources below:   Patients with Medicaid: [facility] [address], [phone], [phone]  If unable to pay, or uninsured, contact [facility] ([phone]) or [facility] ([phone] in [place], [phone] in [place]) to become qualified for the adult dental clinic  Other Low-Cost Community Dental Services: [facility]- [address], [place], [place], [address]    [phone], Ext. [phone]    2nd and 4th Thursday of the month at 6:30am    10 clients each day by appointment, can sometimes see walk-in     patients if someone does not show for an appointment [facility]- [address], [place], [place], [address]    [phone]  [facility]- [address], [place], , [address]    [phone]  [facility]- [phone] [facility]- [phone] [facility]- [phone]  

## 2018-03-01 ENCOUNTER — Other Ambulatory Visit: Payer: Self-pay

## 2018-03-01 ENCOUNTER — Encounter (HOSPITAL_COMMUNITY): Payer: Self-pay | Admitting: Urology

## 2018-03-01 NOTE — Progress Notes (Signed)
Spoke with Ginger, pt's mom. Pt is diabetic but does not check his blood sugar and is not on any medicine for DM. Mom requesting pt surgery be moved to a later time in the day. She will call Dr. Randa EvensJensen's office

## 2018-03-02 ENCOUNTER — Ambulatory Visit (HOSPITAL_COMMUNITY): Payer: Medicaid Other | Admitting: Anesthesiology

## 2018-03-02 ENCOUNTER — Ambulatory Visit (HOSPITAL_COMMUNITY)
Admission: RE | Admit: 2018-03-02 | Discharge: 2018-03-02 | Disposition: A | Discharge status: Home / Self Care | Payer: Medicaid Other | Attending: Oral Surgery | Admitting: Oral Surgery

## 2018-03-02 ENCOUNTER — Encounter (HOSPITAL_COMMUNITY)
Admission: RE | Disposition: A | Discharge status: Home / Self Care | Payer: Self-pay | Source: Home / Self Care | Attending: Oral Surgery

## 2018-03-02 ENCOUNTER — Encounter (HOSPITAL_COMMUNITY): Payer: Self-pay

## 2018-03-02 DIAGNOSIS — I10 Essential (primary) hypertension: Secondary | ICD-10-CM | POA: Insufficient documentation

## 2018-03-02 DIAGNOSIS — E785 Hyperlipidemia, unspecified: Secondary | ICD-10-CM | POA: Diagnosis not present

## 2018-03-02 DIAGNOSIS — K011 Impacted teeth: Secondary | ICD-10-CM | POA: Insufficient documentation

## 2018-03-02 DIAGNOSIS — E119 Type 2 diabetes mellitus without complications: Secondary | ICD-10-CM | POA: Diagnosis not present

## 2018-03-02 DIAGNOSIS — Z794 Long term (current) use of insulin: Secondary | ICD-10-CM | POA: Diagnosis not present

## 2018-03-02 HISTORY — PX: TOOTH EXTRACTION: SHX859

## 2018-03-02 LAB — CBC
HCT: 48.2 % (ref 39.0–52.0)
Hemoglobin: 15.5 g/dL (ref 13.0–17.0)
MCH: 27.2 pg (ref 26.0–34.0)
MCHC: 32.2 g/dL (ref 30.0–36.0)
MCV: 84.6 fL (ref 80.0–100.0)
PLATELETS: 364 10*3/uL (ref 150–400)
RBC: 5.7 MIL/uL (ref 4.22–5.81)
RDW: 12.4 % (ref 11.5–15.5)
WBC: 9.7 10*3/uL (ref 4.0–10.5)
nRBC: 0 % (ref 0.0–0.2)

## 2018-03-02 LAB — GLUCOSE, CAPILLARY
Glucose-Capillary: 292 mg/dL — ABNORMAL HIGH (ref 70–99)
Glucose-Capillary: 349 mg/dL — ABNORMAL HIGH (ref 70–99)
Glucose-Capillary: 299 mg/dL — ABNORMAL HIGH (ref 70–99)
Glucose-Capillary: 306 mg/dL — ABNORMAL HIGH (ref 70–99)

## 2018-03-02 LAB — BASIC METABOLIC PANEL
ANION GAP: 16 — AB (ref 5–15)
BUN: <5 mg/dL — ABNORMAL LOW (ref 6–20)
CALCIUM: 9.4 mg/dL (ref 8.9–10.3)
CO2: 24 mmol/L (ref 22–32)
Chloride: 92 mmol/L — ABNORMAL LOW (ref 98–111)
Creatinine, Ser: 1.03 mg/dL (ref 0.61–1.24)
GFR calc Af Amer: >60 mL/min (ref >60)
GFR calc non Af Amer: >60 mL/min (ref >60)
Glucose, Bld: 341 mg/dL — ABNORMAL HIGH (ref 70–99)
Potassium: 4.1 mmol/L (ref 3.5–5.1)
Sodium: 132 mmol/L — ABNORMAL LOW (ref 135–145)

## 2018-03-02 LAB — NO BLOOD PRODUCTS

## 2018-03-02 SURGERY — DENTAL RESTORATION/EXTRACTIONS
Anesthesia: General | Laterality: Bilateral

## 2018-03-02 MED ORDER — INSULIN ASPART 100 UNIT/ML ~~LOC~~ SOLN
8.0000 [IU] | SUBCUTANEOUS | Status: AC
  Administered 2018-03-02: 8 [IU] via SUBCUTANEOUS

## 2018-03-02 MED ORDER — SUCCINYLCHOLINE CHLORIDE 200 MG/10ML IV SOSY
PREFILLED_SYRINGE | INTRAVENOUS | Status: AC
  Filled 2018-03-02: qty 10

## 2018-03-02 MED ORDER — MIDAZOLAM HCL 2 MG/2ML IJ SOLN
INTRAMUSCULAR | Status: AC
  Filled 2018-03-02: qty 2

## 2018-03-02 MED ORDER — FENTANYL CITRATE (PF) 100 MCG/2ML IJ SOLN
INTRAMUSCULAR | Status: DC | PRN
  Administered 2018-03-02: 100 ug via INTRAVENOUS

## 2018-03-02 MED ORDER — PROPOFOL 10 MG/ML IV BOLUS
INTRAVENOUS | Status: DC | PRN
  Administered 2018-03-02: 50 mg via INTRAVENOUS
  Administered 2018-03-02: 240 mg via INTRAVENOUS

## 2018-03-02 MED ORDER — SUGAMMADEX SODIUM 500 MG/5ML IV SOLN
INTRAVENOUS | Status: DC | PRN
  Administered 2018-03-02: 400 mg via INTRAVENOUS

## 2018-03-02 MED ORDER — ONDANSETRON HCL 4 MG/2ML IJ SOLN
INTRAMUSCULAR | Status: DC | PRN
  Administered 2018-03-02: 4 mg via INTRAVENOUS

## 2018-03-02 MED ORDER — AMOXICILLIN 500 MG PO CAPS: 500.0000 mg | ORAL_CAPSULE | Freq: Three times a day (TID) | ORAL | 0 refills | Status: DC

## 2018-03-02 MED ORDER — INSULIN ASPART 100 UNIT/ML ~~LOC~~ SOLN
8.0000 [IU] | Freq: Once | SUBCUTANEOUS | Status: AC
  Administered 2018-03-02: 8 [IU] via INTRAVENOUS
  Filled 2018-03-02: qty 1

## 2018-03-02 MED ORDER — LIDOCAINE-EPINEPHRINE 2 %-1:100000 IJ SOLN
INTRAMUSCULAR | Status: AC
  Filled 2018-03-02: qty 1

## 2018-03-02 MED ORDER — SUCCINYLCHOLINE CHLORIDE 20 MG/ML IJ SOLN
INTRAMUSCULAR | Status: DC | PRN
  Administered 2018-03-02: 100 mg via INTRAVENOUS

## 2018-03-02 MED ORDER — SUGAMMADEX SODIUM 500 MG/5ML IV SOLN
INTRAVENOUS | Status: AC
  Filled 2018-03-02: qty 5

## 2018-03-02 MED ORDER — FENTANYL CITRATE (PF) 100 MCG/2ML IJ SOLN
50.0000 ug | Freq: Once | INTRAMUSCULAR | Status: AC
  Administered 2018-03-02: 50 ug via INTRAVENOUS
  Filled 2018-03-02: qty 2

## 2018-03-02 MED ORDER — IBUPROFEN 800 MG PO TABS: 800.0000 mg | ORAL_TABLET | Freq: Three times a day (TID) | ORAL | 0 refills | Status: DC | PRN

## 2018-03-02 MED ORDER — LABETALOL HCL 5 MG/ML IV SOLN
10.0000 mg | Freq: Once | INTRAVENOUS | Status: AC
  Administered 2018-03-02: 10 mg via INTRAVENOUS

## 2018-03-02 MED ORDER — OXYCODONE HCL 5 MG/5ML PO SOLN
ORAL | Status: AC
  Filled 2018-03-02: qty 5

## 2018-03-02 MED ORDER — OXYCODONE HCL 5 MG PO TABS: 5.0000 mg | ORAL_TABLET | Freq: Once | ORAL | Status: AC | PRN

## 2018-03-02 MED ORDER — FENTANYL CITRATE (PF) 100 MCG/2ML IJ SOLN
25.0000 ug | INTRAMUSCULAR | Status: DC | PRN
  Administered 2018-03-02: 50 ug via INTRAVENOUS

## 2018-03-02 MED ORDER — INSULIN ASPART 100 UNIT/ML ~~LOC~~ SOLN
SUBCUTANEOUS | Status: AC
  Filled 2018-03-02: qty 1

## 2018-03-02 MED ORDER — ROCURONIUM BROMIDE 50 MG/5ML IV SOSY
PREFILLED_SYRINGE | INTRAVENOUS | Status: DC | PRN
  Administered 2018-03-02: 30 mg via INTRAVENOUS

## 2018-03-02 MED ORDER — PROPOFOL 10 MG/ML IV BOLUS
INTRAVENOUS | Status: AC
  Filled 2018-03-02: qty 40

## 2018-03-02 MED ORDER — DEXAMETHASONE SODIUM PHOSPHATE 10 MG/ML IJ SOLN
INTRAMUSCULAR | Status: DC | PRN
  Administered 2018-03-02: 10 mg via INTRAVENOUS

## 2018-03-02 MED ORDER — ONDANSETRON HCL 4 MG/2ML IJ SOLN
INTRAMUSCULAR | Status: AC
  Filled 2018-03-02: qty 2

## 2018-03-02 MED ORDER — LABETALOL HCL 5 MG/ML IV SOLN
10.0000 mg | INTRAVENOUS | Status: DC | PRN
  Administered 2018-03-02: 10 mg via INTRAVENOUS

## 2018-03-02 MED ORDER — FENTANYL CITRATE (PF) 250 MCG/5ML IJ SOLN
INTRAMUSCULAR | Status: AC
  Filled 2018-03-02: qty 5

## 2018-03-02 MED ORDER — SODIUM CHLORIDE 0.9 % IV SOLN
INTRAVENOUS | Status: AC | PRN
  Administered 2018-03-02: 1000 mL via INTRAMUSCULAR

## 2018-03-02 MED ORDER — OXYMETAZOLINE HCL 0.05 % NA SOLN
NASAL | Status: AC
  Filled 2018-03-02: qty 15

## 2018-03-02 MED ORDER — DEXAMETHASONE SODIUM PHOSPHATE 10 MG/ML IJ SOLN
INTRAMUSCULAR | Status: AC
  Filled 2018-03-02: qty 1

## 2018-03-02 MED ORDER — 0.9 % SODIUM CHLORIDE (POUR BTL) OPTIME
TOPICAL | Status: DC | PRN
  Administered 2018-03-02: 1000 mL

## 2018-03-02 MED ORDER — OXYMETAZOLINE HCL 0.05 % NA SOLN
NASAL | Status: DC | PRN
  Administered 2018-03-02: 1 via TOPICAL

## 2018-03-02 MED ORDER — LIDOCAINE 2% (20 MG/ML) 5 ML SYRINGE
INTRAMUSCULAR | Status: AC
  Filled 2018-03-02: qty 5

## 2018-03-02 MED ORDER — ONDANSETRON HCL 4 MG/2ML IJ SOLN: 4.0000 mg | Freq: Four times a day (QID) | INTRAMUSCULAR | Status: DC | PRN

## 2018-03-02 MED ORDER — FENTANYL CITRATE (PF) 100 MCG/2ML IJ SOLN
INTRAMUSCULAR | Status: AC
  Filled 2018-03-02: qty 2

## 2018-03-02 MED ORDER — OXYCODONE HCL 5 MG/5ML PO SOLN
5.0000 mg | Freq: Once | ORAL | Status: AC | PRN
  Administered 2018-03-02: 5 mg via ORAL

## 2018-03-02 MED ORDER — LABETALOL HCL 5 MG/ML IV SOLN
INTRAVENOUS | Status: AC
  Administered 2018-03-02: 10 mg via INTRAVENOUS
  Filled 2018-03-02: qty 4

## 2018-03-02 MED ORDER — OXYCODONE-ACETAMINOPHEN 5-325 MG PO TABS: 1.0000 | ORAL_TABLET | ORAL | 0 refills | Status: DC | PRN

## 2018-03-02 MED ORDER — LABETALOL HCL 5 MG/ML IV SOLN
INTRAVENOUS | Status: AC
  Filled 2018-03-02: qty 4

## 2018-03-02 MED ORDER — LIDOCAINE-EPINEPHRINE 2 %-1:100000 IJ SOLN
INTRAMUSCULAR | Status: DC | PRN
  Administered 2018-03-02: 15 mg via INTRADERMAL

## 2018-03-02 MED ORDER — LIDOCAINE 2% (20 MG/ML) 5 ML SYRINGE
INTRAMUSCULAR | Status: DC | PRN
  Administered 2018-03-02: 100 mg via INTRAVENOUS

## 2018-03-02 MED ORDER — ROCURONIUM BROMIDE 50 MG/5ML IV SOSY
PREFILLED_SYRINGE | INTRAVENOUS | Status: AC
  Filled 2018-03-02: qty 5

## 2018-03-02 MED ORDER — MIDAZOLAM HCL 5 MG/5ML IJ SOLN
INTRAMUSCULAR | Status: DC | PRN
  Administered 2018-03-02: 2 mg via INTRAVENOUS

## 2018-03-02 MED ORDER — LACTATED RINGERS IV SOLN
INTRAVENOUS | Status: DC
  Administered 2018-03-02: 07:00:00 via INTRAVENOUS

## 2018-03-02 SURGICAL SUPPLY — 39 items
BLADE SURG 15 STRL LF DISP TIS (BLADE) ×1 IMPLANT
BLADE SURG 15 STRL SS (BLADE) ×3
BUR CROSS CUT FISSURE 1.6 (BURR) ×2 IMPLANT
BUR CROSS CUT FISSURE 1.6MM (BURR) ×1
BUR EGG ELITE 4.0 (BURR) ×1 IMPLANT
BUR EGG ELITE 4.0MM (BURR)
CANISTER SUCT 3000ML PPV (MISCELLANEOUS) ×1 IMPLANT
COVER SURGICAL LIGHT HANDLE (MISCELLANEOUS) ×3 IMPLANT
COVER WAND RF STERILE (DRAPES) ×1 IMPLANT
DECANTER SPIKE VIAL GLASS SM (MISCELLANEOUS) ×3 IMPLANT
DRAPE U-SHAPE 76X120 STRL (DRAPES) ×3 IMPLANT
GAUZE PACKING FOLDED 2  STR (GAUZE/BANDAGES/DRESSINGS) ×2
GAUZE PACKING FOLDED 2 STR (GAUZE/BANDAGES/DRESSINGS) ×1 IMPLANT
GLOVE BIO SURGEON STRL SZ 6.5 (GLOVE) IMPLANT
GLOVE BIO SURGEON STRL SZ7 (GLOVE) IMPLANT
GLOVE BIO SURGEON STRL SZ7.5 (GLOVE) ×3 IMPLANT
GLOVE BIO SURGEONS STRL SZ 6.5 (GLOVE)
GLOVE BIOGEL PI IND STRL 6.5 (GLOVE) IMPLANT
GLOVE BIOGEL PI IND STRL 7.0 (GLOVE) IMPLANT
GLOVE BIOGEL PI INDICATOR 6.5 (GLOVE)
GLOVE BIOGEL PI INDICATOR 7.0 (GLOVE)
GOWN STRL REUS W/ TWL LRG LVL3 (GOWN DISPOSABLE) ×1 IMPLANT
GOWN STRL REUS W/ TWL XL LVL3 (GOWN DISPOSABLE) ×1 IMPLANT
GOWN STRL REUS W/TWL LRG LVL3 (GOWN DISPOSABLE) ×3
GOWN STRL REUS W/TWL XL LVL3 (GOWN DISPOSABLE) ×3
IV NS 1000ML (IV SOLUTION) ×3
IV NS 1000ML BAXH (IV SOLUTION) ×1 IMPLANT
KIT BASIN OR (CUSTOM PROCEDURE TRAY) ×3 IMPLANT
KIT TURNOVER KIT B (KITS) ×3 IMPLANT
MANIFOLD NEPTUNE II (INSTRUMENTS) ×2 IMPLANT
NEEDLE 22X1 1/2 (OR ONLY) (NEEDLE) ×6 IMPLANT
NS IRRIG 1000ML POUR BTL (IV SOLUTION) ×3 IMPLANT
PAD ARMBOARD 7.5X6 YLW CONV (MISCELLANEOUS) ×3 IMPLANT
SPONGE SURGIFOAM ABS GEL 12-7 (HEMOSTASIS) IMPLANT
SUT CHROMIC 3 0 PS 2 (SUTURE) ×3 IMPLANT
SYR CONTROL 10ML LL (SYRINGE) ×3 IMPLANT
TRAY ENT MC OR (CUSTOM PROCEDURE TRAY) ×3 IMPLANT
TUBING IRRIGATION (MISCELLANEOUS) ×3 IMPLANT
YANKAUER SUCT BULB TIP NO VENT (SUCTIONS) ×3 IMPLANT

## 2018-03-02 NOTE — Progress Notes (Signed)
Spoke with diabetic coordinator/ will see b/f discharge

## 2018-03-02 NOTE — H&P (Signed)
HISTORY AND PHYSICAL  Mason Pratt is a 22 y.o. male patient with CC: painful wisdom teeth  No diagnosis found.  Past Medical History:  Diagnosis Date  . Diabetes mellitus without complication (HCC)   . Hyperlipidemia   . Hypertension     Current Facility-Administered Medications  Medication Dose Route Frequency Provider Last Rate Last Dose  . fentaNYL (SUBLIMAZE) injection 50 mcg  50 mcg Intravenous Once Hodierne, Adam, MD      . insulin aspart (novoLOG) injection 8 Units  8 Units Intravenous Once Hodierne, Adam, MD       No Known Allergies Active Problems:   * No active hospital problems. *  Vitals: Blood pressure (!) 175/109, pulse 94, resp. rate 18, height 6\' 8"  (2.032 m), weight (!) 154.2 kg, SpO2 99 %. Lab results: Results for orders placed or performed during the hospital encounter of 03/02/18 (from the past 24 hour(s))  CBC     Status: None   Collection Time: 03/02/18  6:32 AM  Result Value Ref Range   WBC 9.7 4.0 - 10.5 K/uL   RBC 5.70 4.22 - 5.81 MIL/uL   Hemoglobin 15.5 13.0 - 17.0 g/dL   HCT 16.148.2 09.639.0 - 04.552.0 %   MCV 84.6 80.0 - 100.0 fL   MCH 27.2 26.0 - 34.0 pg   MCHC 32.2 30.0 - 36.0 g/dL   RDW 40.912.4 81.111.5 - 91.415.5 %   Platelets 364 150 - 400 K/uL   nRBC 0.0 0.0 - 0.2 %  Glucose, capillary     Status: Abnormal   Collection Time: 03/02/18  6:34 AM  Result Value Ref Range   Glucose-Capillary 349 (H) 70 - 99 mg/dL   Comment 1 Notify RN    Radiology Results: No results found. General appearance: alert, cooperative and moderately obese Head: Normocephalic, without obvious abnormality, atraumatic Eyes: negative Nose: Nares normal. Septum midline. Mucosa normal. No drainage or sinus tenderness. Throat: lips, mucosa, and tongue normal; teeth and gums normal and impacted teeth 1, 16, 17, 32 Neck: no adenopathy, supple, symmetrical, trachea midline and thyroid not enlarged, symmetric, no tenderness/mass/nodules Resp: clear to auscultation bilaterally Cardio:  regular rate and rhythm, S1, S2 normal, no murmur, click, rub or gallop  Assessment: Impacted teeth 1, 16, 17, 32.  Plan: Dental extractions with GA. Day surgery.    Ocie DoyneScott Elaiza Shoberg 03/02/2018

## 2018-03-02 NOTE — Op Note (Signed)
03/02/2018  8:12 AM  PATIENT:  Mason Pratt  22 y.o. male  PRE-OPERATIVE DIAGNOSIS:  NON RESTORABLE TEETH # 1, 16, 17, 32  POST-OPERATIVE DIAGNOSIS:  SAME  PROCEDURE:  Procedure(s): EXTRACTION TEETH # 1, 16, 17, 32  SURGEON:  Surgeon(s): Ocie DoyneJensen, Colton Engdahl, DDS  ANESTHESIA:   local and general  EBL:  minimal  DRAINS: none   SPECIMEN:  No Specimen  COUNTS:  YES  PLAN OF CARE: Discharge to home after PACU  PATIENT DISPOSITION:  PACU - hemodynamically stable.   PROCEDURE DETAILS: Dictation # [id number]  Mason LopesScott M. Malijah Pratt, DMD 03/02/2018 8:12 AM

## 2018-03-02 NOTE — Progress Notes (Signed)
Inpatient Diabetes Program Recommendations  AACE/ADA: New Consensus Statement on Inpatient Glycemic Control (2015)  Target Ranges:  Prepandial:   less than 140 mg/dL      Peak postprandial:   less than 180 mg/dL (1-2 hours)      Critically ill patients:  140 - 180 mg/dL   Lab Results  Component Value Date   GLUCAP 299 (H) 03/02/2018    Review of Glycemic Control Results for Mason BuffCOOPER, Mason A (MRN [id number]) as of 03/02/2018 10:33  Ref. Range 03/02/2018 06:34 03/02/2018 07:25 03/02/2018 08:26  Glucose-Capillary Latest Ref Range: 70 - 99 mg/dL 409349 (H) 811292 (H) 914299 (H)   Diabetes history: Type 2 DM Outpatient Diabetes medications: none Current orders for Inpatient glycemic control: Novolog 8 units x 2, Decadron 10 mg x 1  Inpatient Diabetes Program Recommendations:    Spoke with patient and mother at bedside regarding outpatient management of diabetes. Patient was diagnosed at age 22 and was last seen by Dr Shawnee KnappLevy on 08/03/2017. At that visit, patient's A1C was 15% indicating poor glycemic control. Patient was supposed to be taking Lantus 40 units QHS and Metformin 500 mg BID. Patient states, "I am not currently taking those medications because I was tired of being stuck."  Reviewed patient's last A1c of 15% from 07/2017. Explained what a A1c is and what it measures. Explained that with patient being off medications that this value is most likely more elevated. Also reviewed goal A1c with patient, importance of good glucose control @ home, and blood sugar goals. Reviewed at length the importance of establishing control, impact of BS to infection risks especially post op, role of pancreas, need for insulin, vascular changes that occur, and comorbidites.   Patient has a meter but was not checking blood sugars. Discussed with mother to start encouraging patient to take medications as prescribed and to start checking CBGs 2xs per day if able. Per mother, "He is so stubborn and may not want to."  Reviewed other modalities of insulin administration such as insulin pump and continuous glucose monitors; that this may provide options that may limit some needle sticks. However, he would need to begin taking medications and checking blood sugar until he follows back up with his endocrinologist to discuss this option. Patient and mother both have understanding and plan to follow up with Dr Shawnee KnappLevy. They have no further questions at this time.   Thanks, Lujean RaveLauren Dariann Huckaba, MSN, RNC-OB Diabetes Coordinator [phone] (8a-5p)

## 2018-03-02 NOTE — Anesthesia Procedure Notes (Signed)
Procedure Name: Intubation Date/Time: 03/02/2018 7:46 AM Performed by: Fransisca KaufmannMeyer, Cyrilla Durkin E, CRNA Pre-anesthesia Checklist: Patient identified, Emergency Drugs available, Suction available and Patient being monitored Patient Re-evaluated:Patient Re-evaluated prior to induction Oxygen Delivery Method: Circle System Utilized Preoxygenation: Pre-oxygenation with 100% oxygen Induction Type: IV induction Ventilation: Mask ventilation without difficulty Laryngoscope Size: Miller and 3 Grade View: Grade II Nasal Tubes: Nasal Rae, Nasal prep performed and Right Tube size: 7.5 mm Number of attempts: 1 Placement Confirmation: ETT inserted through vocal cords under direct vision,  positive ETCO2 and breath sounds checked- equal and bilateral Tube secured with: Tape Dental Injury: Teeth and Oropharynx as per pre-operative assessment  Difficulty Due To: Difficulty was anticipated, Difficult Airway- due to large tongue, Difficult Airway- due to reduced neck mobility, Difficult Airway-  due to edematous airway, Difficult Airway- due to limited oral opening and Difficult Airway- due to dentition

## 2018-03-02 NOTE — Anesthesia Preprocedure Evaluation (Signed)
Anesthesia Evaluation  Patient identified by MRN, date of birth, ID band Patient awake    Reviewed: Allergy & Precautions, H&P , NPO status , Patient's Chart, lab work & pertinent test results  Airway Mallampati: II   Neck ROM: full    Dental   Pulmonary neg pulmonary ROS,    breath sounds clear to auscultation       Cardiovascular hypertension,  Rhythm:regular Rate:Normal     Neuro/Psych    GI/Hepatic   Endo/Other  diabetes, Type 2  Renal/GU      Musculoskeletal   Abdominal   Peds  Hematology   Anesthesia Other Findings   Reproductive/Obstetrics                             Anesthesia Physical Anesthesia Plan  ASA: II  Anesthesia Plan: General   Post-op Pain Management:    Induction: Intravenous  PONV Risk Score and Plan: 2 and Ondansetron, Dexamethasone, Midazolam and Treatment may vary due to age or medical condition  Airway Management Planned: Nasal ETT  Additional Equipment:   Intra-op Plan:   Post-operative Plan: Extubation in OR  Informed Consent: I have reviewed the patients History and Physical, chart, labs and discussed the procedure including the risks, benefits and alternatives for the proposed anesthesia with the patient or authorized representative who has indicated his/her understanding and acceptance.     Plan Discussed with: Anesthesiologist, CRNA and Surgeon  Anesthesia Plan Comments:         Anesthesia Quick Evaluation

## 2018-03-02 NOTE — Progress Notes (Signed)
Dr. Chaney MallingHodierne notified of patient's CBG of 349.  Patient does not like to be "stuck" per patient's mother so patient does not check his CBG at home and also refuses to take his DM medications.  Received order for 8 units novolog IV.    Also notified of patient's elevated BP of 188/110.  Received order to give 10mg  labetalol IV.  Will continue to montior patient.  Patient's pain 10/10.  Received order for 50mcg fentanyl.  Patient placed on monitor, will continue to monitor

## 2018-03-02 NOTE — Progress Notes (Signed)
Spoke with dr Chaney Mallinghodierne Ferne Reus/mda after insulin, cbg remains at 306. Gives no further orders yet asks  Upon d/c the mother be reinforced of pt's need to  Check ccbg and take necessary meds

## 2018-03-02 NOTE — Transfer of Care (Signed)
Immediate Anesthesia Transfer of Care Note  Patient: Mason Pratt  Procedure(s) Performed: DENTAL RESTORATION/EXTRACTIONS (Bilateral )  Patient Location: PACU  Anesthesia Type:General  Level of Consciousness: awake, alert , oriented and sedated  Airway & Oxygen Therapy: Patient Spontanous Breathing  Post-op Assessment: Report given to RN, Post -op Vital signs reviewed and stable and Patient moving all extremities  Post vital signs: Reviewed and stable  Last Vitals:  Vitals Value Taken Time  BP 171/109 03/02/2018  8:25 AM  Temp    Pulse 104 03/02/2018  8:27 AM  Resp 23 03/02/2018  8:27 AM  SpO2 100 % 03/02/2018  8:27 AM  Vitals shown include unvalidated device data.  Last Pain:  Vitals:   03/02/18 0720  PainSc: 7          Complications: No apparent anesthesia complications

## 2018-03-02 NOTE — Op Note (Signed)
NAME: Mason Pratt, Mason A. MEDICAL RECORD [id number] ACCOUNT [id number] DATE OF BIRTH:[birth year] FACILITY: [facility] LOCATION: [facility] PHYSICIAN:Lusero Nordlund M. Kandy Towery, DDS  OPERATIVE REPORT  DATE OF PROCEDURE:  03/02/2018  PREOPERATIVE DIAGNOSIS:  Nonrestorable teeth numbers 1, 16, 17 and 32.  POSTOPERATIVE DIAGNOSIS:  Nonrestorable teeth numbers 1, 16, 17 and 32.  PROCEDURE:  Extraction teeth numbers 1, 16, 17 and 32.  SURGEON:  Ocie DoyneScott Keimora Swartout, DDS  ANESTHESIA:  General, Dr. Chaney MallingHodierne attending, nasal intubation.  DESCRIPTION OF PROCEDURE:  The patient was taken to the operating room and placed on the table in supine position.  General anesthesia was administered intravenously and a nasal endotracheal tube was placed and secured.  The eyes were protected and the  patient was draped for surgery.  A timeout was performed.  The posterior pharynx was suctioned and a throat pack was placed, 2% lidocaine 1:100,000 epinephrine was infiltrated in the inferior alveolar block on the right and left sides and in buccal and  palatal infiltration in the maxilla around teeth 1 and 16.  A bite block was placed on the right side of the mouth and a sweetheart retractor was used to retract the tongue.  A #15 blade was used to make an incision around teeth 17 and 16 in the gingival  sulcus.  The periosteum was reflected with a periosteal elevator.  The teeth were elevated with a 301 dental elevator and removed from the mouth with the forceps.  The sockets were curetted and irrigated and closed with 3-0 chromic.  The sweetheart  retractor and bite block were repositioned to the other side of the mouth.  A #15 blade was used to incise around teeth 1 and 32.  The periosteum was reflected and the teeth were elevated and removed with the dental forceps.  The sockets were curetted,  irrigated, and closed with 3-0 chromic and the oral cavity was irrigated and suctioned and a throat pack was removed.  The patient was left in care  of anesthesia for extubation and transport to recovery room with plans for discharge home through day  surgery.  ESTIMATED BLOOD LOSS:  Minimal.  COMPLICATIONS:  None.  SPECIMENS:  None.  TN/NUANCE  D:03/02/2018 T:03/02/2018 JOB:[id number]

## 2018-03-03 ENCOUNTER — Encounter (HOSPITAL_COMMUNITY): Payer: Self-pay | Admitting: Oral Surgery

## 2018-03-03 NOTE — Anesthesia Postprocedure Evaluation (Signed)
Anesthesia Post Note  Patient: Mason Pratt  Procedure(s) Performed: DENTAL RESTORATION/EXTRACTIONS (Bilateral )     Patient location during evaluation: PACU Anesthesia Type: General Level of consciousness: awake and alert Pain management: pain level controlled Vital Signs Assessment: post-procedure vital signs reviewed and stable Respiratory status: spontaneous breathing, nonlabored ventilation, respiratory function stable and patient connected to nasal cannula oxygen Cardiovascular status: blood pressure returned to baseline and stable Postop Assessment: no apparent nausea or vomiting Anesthetic complications: no    Last Vitals:  Vitals:   03/02/18 0910 03/02/18 0918  BP: (!) 154/89 (!) 163/94  Pulse: 81 82  Resp: 16 17  Temp:    SpO2: 97% 97%    Last Pain:  Vitals:   03/02/18 0918  PainSc: 3                  Anyae Griffith S

## 2018-03-16 ENCOUNTER — Emergency Department (HOSPITAL_COMMUNITY)
Admission: EM | Admit: 2018-03-16 | Discharge: 2018-03-16 | Disposition: A | Discharge status: Home / Self Care | Payer: Medicaid Other | Attending: Emergency Medicine | Admitting: Emergency Medicine

## 2018-03-16 ENCOUNTER — Encounter (HOSPITAL_COMMUNITY): Payer: Self-pay | Admitting: Emergency Medicine

## 2018-03-16 ENCOUNTER — Other Ambulatory Visit: Payer: Self-pay

## 2018-03-16 DIAGNOSIS — G8918 Other acute postprocedural pain: Secondary | ICD-10-CM | POA: Insufficient documentation

## 2018-03-16 DIAGNOSIS — E119 Type 2 diabetes mellitus without complications: Secondary | ICD-10-CM | POA: Diagnosis not present

## 2018-03-16 DIAGNOSIS — K1379 Other lesions of oral mucosa: Secondary | ICD-10-CM

## 2018-03-16 MED ORDER — LIDOCAINE VISCOUS HCL 2 % MT SOLN: 15.0000 mL | OROMUCOSAL | 0 refills | Status: DC | PRN

## 2018-03-16 MED ORDER — IBUPROFEN 800 MG PO TABS: 800.0000 mg | ORAL_TABLET | Freq: Three times a day (TID) | ORAL | 0 refills | Status: DC

## 2018-03-16 NOTE — ED Provider Notes (Signed)
[facility] EMERGENCY DEPARTMENT Provider Note   CSN: [id number] Arrival date & time: 03/16/18  0941     History   Chief Complaint Chief Complaint  Patient presents with  . pain after wisdom teeth removal    HPI Mason Pratt is a 22 y.o. male with a past medical history of diabetes, hypertension who presents to ED for right cheek pain for the past several weeks.  Patient had 4 wisdom teeth extracted on 03/02/2018.  Since then he has had to follow-up with his dentist twice, once for a dry socket which improved with tea bags. He states that he had this pain in his right cheek even prior to the surgery.  He was told by his dentist that "I do not know what it is but it is going to go away probably."  States that he has been taking the antibiotics and pain medication but has since ran out of both.  Denies any worsening symptoms, fever, dental pain, facial swelling, trouble breathing or trouble swallowing.  HPI  Past Medical History:  Diagnosis Date  . Diabetes mellitus without complication (HCC)   . Hyperlipidemia   . Hypertension     There are no active problems to display for this patient.   Past Surgical History:  Procedure Laterality Date  . TOOTH EXTRACTION Bilateral 03/02/2018   Procedure: DENTAL RESTORATION/EXTRACTIONS;  Surgeon: Ocie Doyne, DDS;  Location: [facility] OR;  Service: Oral Surgery;  Laterality: Bilateral;        Home Medications    Prior to Admission medications   Medication Sig Start Date End Date Taking? Authorizing Provider  amoxicillin (AMOXIL) 500 MG capsule Take 1 capsule (500 mg total) by mouth 3 (three) times daily. 03/02/18   Ocie Doyne, DDS  chlorhexidine (PERIDEX) 0.12 % solution 15 mLs by Mouth Rinse route 2 (two) times daily. 02/15/18   [provider]  clindamycin (CLEOCIN) 300 MG capsule Take 300 mg by mouth 3 (three) times daily.    [provider]  ibuprofen (ADVIL,MOTRIN) 800 MG tablet Take 1 tablet  (800 mg total) by mouth 3 (three) times daily. 03/16/18   Marguriete Wootan, PA-C  insulin glargine (LANTUS) 100 UNIT/ML injection Inject 0.35 mLs (35 Units total) into the skin at bedtime. Patient not taking: Reported on 03/01/2018 04/13/17   Lorre Nick, MD  Lancets (ACCU-CHEK MULTICLIX) lancets Use as instructed 04/13/17   Lorre Nick, MD  lidocaine (XYLOCAINE) 2 % solution Use as directed 15 mLs in the mouth or throat as needed for mouth pain. 03/16/18   Derotha Fishbaugh, PA-C  metFORMIN (GLUCOPHAGE) 500 MG tablet Take 1 tablet (500 mg total) by mouth 2 (two) times daily with a meal. Patient not taking: Reported on 03/01/2018 04/13/17   Lorre Nick, MD  oxyCODONE-acetaminophen (PERCOCET) 5-325 MG tablet Take 1 tablet by mouth every 4 (four) hours as needed. 03/02/18   Ocie Doyne, DDS    Family History No family history on file.  Social History Social History   Tobacco Use  . Smoking status: Never Smoker  . Smokeless tobacco: Never Used  Substance Use Topics  . Alcohol use: No  . Drug use: Never     Allergies   Patient has no known allergies.   Review of Systems Review of Systems  Constitutional: Negative for chills and fever.  HENT: Negative for dental problem, facial swelling and rhinorrhea.        +R cheek pain  Respiratory: Negative for shortness of breath.  Physical Exam Updated Vital Signs BP (!) 159/101 (BP Location: Right Arm)   Pulse (!) 107   Temp 98.1 F (36.7 C) (Oral)   Resp 16   SpO2 97%   Physical Exam Vitals signs and nursing note reviewed.  Constitutional:      General: He is not in acute distress.    Appearance: He is well-developed. He is not diaphoretic.  HENT:     Head: Normocephalic and atraumatic.     Comments: Punctuate area of swelling noted on R buccal mucosa, similar on L buccal mucosa but patient reports tenderness on R side. No gingival swelling, gross dental abscess noted. No dry socket noted. No facial, neck swelling noted. No  pooling of secretions or trismus.  Normal voice noted with no difficulty swallowing or breathing. No submandibular erythema or edema noted. Eyes:     General: No scleral icterus.    Conjunctiva/sclera: Conjunctivae normal.  Neck:     Musculoskeletal: Normal range of motion.  Pulmonary:     Effort: Pulmonary effort is normal. No respiratory distress.  Skin:    Findings: No rash.  Neurological:     Mental Status: He is alert.      ED Treatments / Results  Labs (all labs ordered are listed, but only abnormal results are displayed) Labs Reviewed - No data to display  EKG None  Radiology No results found.  Procedures Procedures (including critical care time)  Medications Ordered in ED Medications - No data to display   Initial Impression / Assessment and Plan / ED Course  I have reviewed the triage vital signs and the nursing notes.  Pertinent labs & imaging results that were available during my care of the patient were reviewed by me and considered in my medical decision making (see chart for details).     22 year old male who is status post wisdom teeth extraction on 03/02/2018 presents to ED for right cheek pain.  States that he noted an area of swelling of his right cheek even before his surgery.  He was told that would improve after the surgery. On exam he has no dry socket, no gingival swelling or dental abscess seen. No signs of Ludwig's angina. Small ?mucocele noted on R buccal mucosa. This area is punctuate and does not cause facial swelling or discomfort. Patient requesting refill of his oxycodone, but I do not feel that this is appropriate as his surgery was 2 weeks ago and he denies actual dental pain. Will defer to dentist. Will give lidocaine to swish and spit and ibuprofen for pain control.  [place] PMP queried.  Patient is hemodynamically stable, in NAD, and able to ambulate in the ED. Evaluation does not show pathology that would require ongoing emergent  intervention or inpatient treatment. I explained the diagnosis to the patient. Pain has been managed and has no complaints prior to discharge. Patient is comfortable with above plan and is stable for discharge at this time. All questions were answered prior to disposition. Strict return precautions for returning to the ED were discussed. Encouraged follow up with PCP.    Portions of this note were generated with Scientist, clinical (histocompatibility and immunogenetics)Dragon dictation software. Dictation errors may occur despite best attempts at proofreading.   Final Clinical Impressions(s) / ED Diagnoses   Final diagnoses:  Mouth pain    ED Discharge Orders         Ordered    lidocaine (XYLOCAINE) 2 % solution  As needed     03/16/18 1017  ibuprofen (ADVIL,MOTRIN) 800 MG tablet  3 times daily     03/16/18 1028           Dietrich PatesKhatri, Nazaire Cordial, PA-C 03/16/18 1033    Charlynne PanderYao, David Hsienta, MD 03/17/18 [phone]

## 2018-03-16 NOTE — ED Notes (Signed)
ED Provider at bedside. 

## 2018-03-16 NOTE — Discharge Instructions (Signed)
Swish and spit lidocaine to help with throat discomfort.  Do not swallow. Return to ED for worsening symptoms, trouble breathing or trouble swallowing, swelling of your gums or face.

## 2018-03-16 NOTE — ED Notes (Signed)
Patient verbalizes understanding of discharge instructions. Opportunity for questioning and answers were provided. Armband removed by staff, pt discharged from ED ambulatory.   

## 2018-03-16 NOTE — ED Triage Notes (Signed)
Pt had wisdom tooth removal 2 weeks ago. Since then pt has still had pain. Pt has had 2 follow ups with dentist. Pt still in pain on the right side. Right side of jaw is swollen

## 2018-03-18 ENCOUNTER — Emergency Department (HOSPITAL_COMMUNITY): Payer: Medicaid Other

## 2018-03-18 ENCOUNTER — Other Ambulatory Visit: Payer: Self-pay

## 2018-03-18 ENCOUNTER — Encounter (HOSPITAL_COMMUNITY): Payer: Self-pay | Admitting: Emergency Medicine

## 2018-03-18 ENCOUNTER — Observation Stay (HOSPITAL_COMMUNITY)
Admission: EM | Admit: 2018-03-18 | Discharge: 2018-03-19 | Disposition: A | Discharge status: Home / Self Care | Payer: Medicaid Other | Attending: Internal Medicine | Admitting: Internal Medicine

## 2018-03-18 DIAGNOSIS — I10 Essential (primary) hypertension: Secondary | ICD-10-CM | POA: Diagnosis present

## 2018-03-18 DIAGNOSIS — E1165 Type 2 diabetes mellitus with hyperglycemia: Secondary | ICD-10-CM | POA: Insufficient documentation

## 2018-03-18 DIAGNOSIS — R739 Hyperglycemia, unspecified: Secondary | ICD-10-CM

## 2018-03-18 DIAGNOSIS — R569 Unspecified convulsions: Secondary | ICD-10-CM | POA: Diagnosis not present

## 2018-03-18 DIAGNOSIS — E871 Hypo-osmolality and hyponatremia: Secondary | ICD-10-CM | POA: Diagnosis present

## 2018-03-18 DIAGNOSIS — K047 Periapical abscess without sinus: Secondary | ICD-10-CM

## 2018-03-18 DIAGNOSIS — Z9114 Patient's other noncompliance with medication regimen: Secondary | ICD-10-CM | POA: Diagnosis not present

## 2018-03-18 DIAGNOSIS — E119 Type 2 diabetes mellitus without complications: Secondary | ICD-10-CM

## 2018-03-18 HISTORY — DX: Type 2 diabetes mellitus without complications: E11.9

## 2018-03-18 HISTORY — DX: Unspecified asthma, uncomplicated: J45.909

## 2018-03-18 HISTORY — DX: Unspecified convulsions: R56.9

## 2018-03-18 HISTORY — DX: Localized swelling, mass and lump, head: R22.0

## 2018-03-18 HISTORY — DX: Mental disorder, not otherwise specified: F99

## 2018-03-18 HISTORY — DX: Attention-deficit hyperactivity disorder, unspecified type: F90.9

## 2018-03-18 HISTORY — DX: Unspecified osteoarthritis, unspecified site: M19.90

## 2018-03-18 HISTORY — DX: Other specified behavioral and emotional disorders with onset usually occurring in childhood and adolescence: F98.8

## 2018-03-18 LAB — CBC
HCT: 41 % (ref 39.0–52.0)
Hemoglobin: 13.9 g/dL (ref 13.0–17.0)
MCH: 28.7 pg (ref 26.0–34.0)
MCHC: 33.9 g/dL (ref 30.0–36.0)
MCV: 84.7 fL (ref 80.0–100.0)
Platelets: 313 10*3/uL (ref 150–400)
RBC: 4.84 MIL/uL (ref 4.22–5.81)
RDW: 12.9 % (ref 11.5–15.5)
WBC: 8.5 10*3/uL (ref 4.0–10.5)
nRBC: 0 % (ref 0.0–0.2)

## 2018-03-18 LAB — URINALYSIS, ROUTINE W REFLEX MICROSCOPIC
BACTERIA UA: NONE SEEN
BILIRUBIN URINE: NEGATIVE
Glucose, UA: >=500 mg/dL — AB
Hgb urine dipstick: NEGATIVE
Ketones, ur: 20 mg/dL — AB
LEUKOCYTES UA: NEGATIVE
NITRITE: NEGATIVE
Protein, ur: NEGATIVE mg/dL
SPECIFIC GRAVITY, URINE: 1.025 (ref 1.005–1.030)
pH: 5 (ref 5.0–8.0)

## 2018-03-18 LAB — GLUCOSE, CAPILLARY
Glucose-Capillary: 417 mg/dL — ABNORMAL HIGH (ref 70–99)
Glucose-Capillary: 296 mg/dL — ABNORMAL HIGH (ref 70–99)

## 2018-03-18 LAB — BASIC METABOLIC PANEL
Anion gap: 15 (ref 5–15)
BUN: 10 mg/dL (ref 6–20)
CHLORIDE: 93 mmol/L — AB (ref 98–111)
CO2: 22 mmol/L (ref 22–32)
CREATININE: 1.1 mg/dL (ref 0.61–1.24)
Calcium: 9 mg/dL (ref 8.9–10.3)
GFR calc Af Amer: >60 mL/min (ref >60)
GFR calc non Af Amer: >60 mL/min (ref >60)
Glucose, Bld: 428 mg/dL — ABNORMAL HIGH (ref 70–99)
Potassium: 3.9 mmol/L (ref 3.5–5.1)
SODIUM: 130 mmol/L — AB (ref 135–145)

## 2018-03-18 LAB — RAPID URINE DRUG SCREEN, HOSP PERFORMED
Amphetamines: NOT DETECTED
Barbiturates: NOT DETECTED
Benzodiazepines: NOT DETECTED
Cocaine: NOT DETECTED
Opiates: NOT DETECTED
Tetrahydrocannabinol: NOT DETECTED

## 2018-03-18 LAB — ETHANOL: Alcohol, Ethyl (B): <10 mg/dL (ref <10)

## 2018-03-18 LAB — HEMOGLOBIN A1C
Hgb A1c MFr Bld: 14.1 % — ABNORMAL HIGH (ref 4.8–5.6)
Mean Plasma Glucose: 357.97 mg/dL

## 2018-03-18 LAB — CBG MONITORING, ED
Glucose-Capillary: 401 mg/dL — ABNORMAL HIGH (ref 70–99)
Glucose-Capillary: 300 mg/dL — ABNORMAL HIGH (ref 70–99)
Glucose-Capillary: 180 mg/dL — ABNORMAL HIGH (ref 70–99)

## 2018-03-18 MED ORDER — LORAZEPAM 2 MG/ML IJ SOLN
INTRAMUSCULAR | Status: AC
  Administered 2018-03-18: 1 mg
  Filled 2018-03-18: qty 1

## 2018-03-18 MED ORDER — LORAZEPAM 2 MG/ML IJ SOLN
1.0000 mg | Freq: Once | INTRAMUSCULAR | Status: DC
  Filled 2018-03-18: qty 1

## 2018-03-18 MED ORDER — VALPROATE SODIUM 500 MG/5ML IV SOLN
1000.0000 mg | Freq: Once | INTRAVENOUS | Status: AC
  Administered 2018-03-18: 1000 mg via INTRAVENOUS
  Filled 2018-03-18: qty 10

## 2018-03-18 MED ORDER — INSULIN ASPART 100 UNIT/ML ~~LOC~~ SOLN
0.0000 [IU] | Freq: Three times a day (TID) | SUBCUTANEOUS | Status: DC
  Administered 2018-03-19: 7 [IU] via SUBCUTANEOUS

## 2018-03-18 MED ORDER — INSULIN ASPART 100 UNIT/ML ~~LOC~~ SOLN
10.0000 [IU] | Freq: Once | SUBCUTANEOUS | Status: AC
  Administered 2018-03-18: 10 [IU] via SUBCUTANEOUS

## 2018-03-18 MED ORDER — INSULIN GLARGINE 100 UNIT/ML ~~LOC~~ SOLN
15.0000 [IU] | Freq: Every day | SUBCUTANEOUS | Status: DC
  Administered 2018-03-18: 15 [IU] via SUBCUTANEOUS
  Filled 2018-03-18: qty 0.15

## 2018-03-18 MED ORDER — ENOXAPARIN SODIUM 40 MG/0.4ML ~~LOC~~ SOLN
40.0000 mg | SUBCUTANEOUS | Status: DC
  Administered 2018-03-18: 40 mg via SUBCUTANEOUS
  Filled 2018-03-18: qty 0.4

## 2018-03-18 MED ORDER — GADOBUTROL 1 MMOL/ML IV SOLN
10.0000 mL | Freq: Once | INTRAVENOUS | Status: AC | PRN
  Administered 2018-03-18: 10 mL via INTRAVENOUS

## 2018-03-18 MED ORDER — SODIUM CHLORIDE 0.9 % IV BOLUS
1000.0000 mL | Freq: Once | INTRAVENOUS | Status: AC
  Administered 2018-03-18: 1000 mL via INTRAVENOUS

## 2018-03-18 MED ORDER — INSULIN ASPART 100 UNIT/ML ~~LOC~~ SOLN
8.0000 [IU] | Freq: Once | SUBCUTANEOUS | Status: AC
  Administered 2018-03-18: 8 [IU] via SUBCUTANEOUS

## 2018-03-18 MED ORDER — SODIUM CHLORIDE 0.9 % IV SOLN
3.0000 g | Freq: Four times a day (QID) | INTRAVENOUS | Status: DC
  Administered 2018-03-18 – 2018-03-19 (×3): 3 g via INTRAVENOUS
  Filled 2018-03-18 (×4): qty 3

## 2018-03-18 MED ORDER — DIVALPROEX SODIUM 250 MG PO DR TAB: 500.0000 mg | DELAYED_RELEASE_TABLET | Freq: Two times a day (BID) | ORAL | Status: DC

## 2018-03-18 MED ORDER — INSULIN ASPART 100 UNIT/ML ~~LOC~~ SOLN
0.0000 [IU] | Freq: Every day | SUBCUTANEOUS | Status: DC
  Administered 2018-03-18: 5 [IU] via SUBCUTANEOUS

## 2018-03-18 MED ORDER — DIVALPROEX SODIUM 250 MG PO DR TAB
500.0000 mg | DELAYED_RELEASE_TABLET | Freq: Two times a day (BID) | ORAL | Status: DC
  Administered 2018-03-18 – 2018-03-19 (×2): 500 mg via ORAL
  Filled 2018-03-18 (×2): qty 2

## 2018-03-18 MED ORDER — ENOXAPARIN SODIUM 80 MG/0.8ML ~~LOC~~ SOLN: 80.0000 mg | SUBCUTANEOUS | Status: DC

## 2018-03-18 NOTE — Progress Notes (Addendum)
11:40am-CSW made aware that pt's mother will need a way home. CSW provided RN with bus pass for pt's mother.   CSW consulted by RN for taxi ride back home. CSW made aware that pt is unable to ride bus as pt has no shoes. CSW collected voucher and was later informed  that pt is having seizures at this time. CSW to hold off on voucher  at this time.     Claude MangesKierra S. Avonda Toso, MSW, LCSW-A Emergency Department Clinical Social Worker [phone]

## 2018-03-18 NOTE — ED Notes (Signed)
Patient placed back on cardiac monitoring. Vital signs stable at this time.

## 2018-03-18 NOTE — Progress Notes (Signed)
Pharmacy Antibiotic Note  Mason Pratt is a 22 y.o. male admitted on 03/18/2018 with dental abscess.  Pharmacy has been consulted for Unasyn dosing.  Plan: Start Unasyn 3g IV Q6h Monitor clinical picture, renal function F/U C&S, abx deescalation / LOT  Height: 6\' 8"  (203.2 cm) Weight: (!) 341 lb 11.4 oz (155 kg) IBW/kg (Calculated) : 96  Temp (24hrs), Avg:98.8 F (37.1 C), Min:98.2 F (36.8 C), Max:99.3 F (37.4 C)  Recent Labs  Lab 03/18/18 0600  WBC 8.5  CREATININE 1.10    Estimated Creatinine Clearance: 178.2 mL/min (by C-G formula based on SCr of 1.1 mg/dL).    No Known Allergies  Thank you for allowing pharmacy to be a part of this patient's care.  Armandina StammerBATCHELDER,Kenna Seward J 03/18/2018 5:50 PM

## 2018-03-18 NOTE — ED Notes (Signed)
Patient transported to MRI with RN Liz BeachGabe

## 2018-03-18 NOTE — Progress Notes (Signed)
Inpatient Diabetes Program Recommendations  AACE/ADA: New Consensus Statement on Inpatient Glycemic Control (2015)  Target Ranges:  Prepandial:   less than 140 mg/dL      Peak postprandial:   less than 180 mg/dL (1-2 hours)      Critically ill patients:  140 - 180 mg/dL   Lab Results  Component Value Date   GLUCAP 300 (H) 03/18/2018    Review of Glycemic Control Results for Florian BuffCOOPER, Oney A (MRN [id number]) as of 03/18/2018 11:05  Ref. Range 03/18/2018 06:02 03/18/2018 10:41  Glucose-Capillary Latest Ref Range: 70 - 99 mg/dL 409401 (H) 811300 (H)   Diabetes history: DM 2 Outpatient Diabetes medications: Lantus 35 units q HS (patient not taking) Metformin 500 mg bid (patient not taking) Inpatient Diabetes Program Recommendations:   Note patient is currently in the ED with seizures.  If admitted please add A1C to labs.  Also consider adding Novolog moderate correction tid with meals and Lantus 25 units daily while in the hospital.    Thanks,  Beryl MeagerJenny Symone Cornman, RN, BC-ADM Inpatient Diabetes Coordinator Pager [phone] (8a-5p)

## 2018-03-18 NOTE — Progress Notes (Signed)
Attempted EEG. Pt unavailable at this time. Pt at another procedure. Will check back when schedule permits.

## 2018-03-18 NOTE — ED Triage Notes (Signed)
BIB [facility] from home with c/o of seizures. Per ems pt's mom heard a thud and found pt on floor in semi-fetal position tremoring and confused. Pt developmentally delayed but mom states A&0X4 on a good day.

## 2018-03-18 NOTE — Procedures (Signed)
History: 22 year old male being evaluated for new onset seizures  Sedation: Ativan given 2 hours prior to exam  Technique: This is a 21 channel routine scalp EEG performed at the bedside with bipolar and monopolar montages arranged in accordance to the international 10/20 system of electrode placement. One channel was dedicated to EKG recording.    Background: There is a posterior dominant rhythm of 9 Hz which is well organized during brief periods of wakefulness.  The predominance of this EEG is performed during sleep and drowsiness.  There is focal irregular delta activity that is prominent over the right temporal region, F8 >T8 >P8 >Fp2, F4.  No epileptiform discharges were seen.  Photic stimulation: Physiologic driving is not performed  EEG Abnormalities: 1) focal irregular right frontotemporal delta activity  Clinical Interpretation: This EEG is consistent with a focal area of cerebral disturbance in the right frontotemporal region.  Though nonspecific, this can be seen as a postictal finding.   There was no seizure or definite seizure predisposition recorded on this study. Please note that lack of epileptiform activity on EEG does not preclude the possibility of epilepsy.   Ritta SlotMcNeill Johnnay Pleitez, MD [facility] [phone]  If 7pm- 7am, please page neurology on call as listed in AMION.

## 2018-03-18 NOTE — ED Notes (Signed)
EEG @ bedside. 

## 2018-03-18 NOTE — ED Notes (Signed)
Pt returned to room from MRI.

## 2018-03-18 NOTE — ED Provider Notes (Addendum)
[facility] EMERGENCY DEPARTMENT Provider Note   CSN: [id number] Arrival date & time: 03/18/18  0548     History   Chief Complaint Chief Complaint  Patient presents with  . Seizures    HPI Mason NEDEAU is a 22 y.o. male with a past medical history of diabetes, hypertension, hyperlipidemia who presents to ED for possible seizure that occurred prior to arrival.  History is provided by mother at bedside. She states that patient woke up this morning in his usual state of health.  He took his antibiotics, mouthwash and pain medication for his recent dental extraction.  He then went upstairs while listening to music and told his mother that he would come downstairs and make breakfast for her.  Mother then heard a thud and found the patient with his head against the wall, and he was "blanked out."  States that after 10 minutes, he slowly "came back to me."  However, he was still not oriented x4 as he would be. She states that he apparently had a seizure when he was 2 to 57 months old.  This was the one and only seizure that he has had in the past.  He has never been on any antiepileptic medication or seen a neurologist. She states "that happened with my other son too, they said that's how they knew he would be a special needs kid."  She states that patient is noncompliant with his diabetes medications and does not check his CBGs anymore. Patient states he remembers waking up in the ambulance.  He does not remember his interaction with his mother this morning.  States that he went to bed last night in his usual state of health. He denies any alcohol, drug use. Mother notes vape use.   HPI  Past Medical History:  Diagnosis Date  . Diabetes mellitus without complication (HCC)   . Hyperlipidemia   . Hypertension     There are no active problems to display for this patient.   Past Surgical History:  Procedure Laterality Date  . TOOTH EXTRACTION Bilateral 03/02/2018   Procedure: DENTAL RESTORATION/EXTRACTIONS;  Surgeon: Ocie Doyne, DDS;  Location: [facility] OR;  Service: Oral Surgery;  Laterality: Bilateral;        Home Medications    Prior to Admission medications   Medication Sig Start Date End Date Taking? Authorizing Provider  chlorhexidine (PERIDEX) 0.12 % solution 15 mLs by Mouth Rinse route 2 (two) times daily. 02/15/18  Yes [provider]  ibuprofen (ADVIL,MOTRIN) 800 MG tablet Take 1 tablet (800 mg total) by mouth 3 (three) times daily. 03/16/18  Yes Vaniyah Lansky, PA-C  lidocaine (XYLOCAINE) 2 % solution Use as directed 15 mLs in the mouth or throat as needed for mouth pain. 03/16/18  Yes Criss Pallone, PA-C  amoxicillin (AMOXIL) 500 MG capsule Take 1 capsule (500 mg total) by mouth 3 (three) times daily. Patient not taking: Reported on 03/18/2018 03/02/18   Ocie Doyne, DDS  insulin glargine (LANTUS) 100 UNIT/ML injection Inject 0.35 mLs (35 Units total) into the skin at bedtime. Patient not taking: Reported on 03/01/2018 04/13/17   Lorre Nick, MD  Lancets (ACCU-CHEK MULTICLIX) lancets Use as instructed 04/13/17   Lorre Nick, MD  metFORMIN (GLUCOPHAGE) 500 MG tablet Take 1 tablet (500 mg total) by mouth 2 (two) times daily with a meal. Patient not taking: Reported on 03/01/2018 04/13/17   Lorre Nick, MD  oxyCODONE-acetaminophen (PERCOCET) 5-325 MG tablet Take 1 tablet by mouth every 4 (four)  hours as needed. Patient not taking: Reported on 03/18/2018 03/02/18   Ocie DoyneJensen, Scott, DDS    Family History No family history on file.  Social History Social History   Tobacco Use  . Smoking status: Never Smoker  . Smokeless tobacco: Never Used  Substance Use Topics  . Alcohol use: No  . Drug use: Never     Allergies   Patient has no known allergies.   Review of Systems Review of Systems  Constitutional: Negative for appetite change, chills and fever.  HENT: Negative for ear pain, rhinorrhea, sneezing and sore throat.     Eyes: Negative for photophobia and visual disturbance.  Respiratory: Negative for cough, chest tightness, shortness of breath and wheezing.   Cardiovascular: Negative for chest pain and palpitations.  Gastrointestinal: Negative for abdominal pain, blood in stool, constipation, diarrhea, nausea and vomiting.  Genitourinary: Negative for dysuria, hematuria and urgency.  Musculoskeletal: Negative for myalgias.  Skin: Negative for rash.  Neurological: Positive for seizures. Negative for dizziness, weakness and light-headedness.     Physical Exam Updated Vital Signs BP (!) 149/101   Pulse (!) 105   Temp 98.2 F (36.8 C) (Oral)   Resp (!) 23   Ht 6\' 8"  (2.032 m)   Wt (!) 155 kg   SpO2 93%   BMI 37.54 kg/m   Physical Exam Vitals signs and nursing note reviewed.  Constitutional:      General: He is not in acute distress.    Appearance: He is well-developed. He is obese.  HENT:     Head: Normocephalic and atraumatic.     Nose: Nose normal.  Eyes:     General: No scleral icterus.       Right eye: No discharge.        Left eye: No discharge.     Conjunctiva/sclera: Conjunctivae normal.     Pupils: Pupils are equal, round, and reactive to light.  Neck:     Musculoskeletal: Normal range of motion and neck supple.  Cardiovascular:     Rate and Rhythm: Regular rhythm. Tachycardia present.     Heart sounds: Normal heart sounds. No murmur. No friction rub. No gallop.   Pulmonary:     Effort: Pulmonary effort is normal. No respiratory distress.     Breath sounds: Normal breath sounds.  Abdominal:     General: Bowel sounds are normal. There is no distension.     Palpations: Abdomen is soft.     Tenderness: There is no abdominal tenderness. There is no guarding.  Musculoskeletal: Normal range of motion.  Skin:    General: Skin is warm and dry.     Findings: No rash.  Neurological:     General: No focal deficit present.     Mental Status: He is alert.     Cranial Nerves: No  cranial nerve deficit.     Sensory: No sensory deficit.     Motor: No weakness or abnormal muscle tone.     Coordination: Coordination normal.     Comments: Alert, oriented to self and family members. Pupils reactive. No facial asymmetry noted. Cranial nerves appear grossly intact. Sensation intact to light touch on face, BUE and BLE. Strength 5/5 in BUE and BLE.      ED Treatments / Results  Labs (all labs ordered are listed, but only abnormal results are displayed) Labs Reviewed  BASIC METABOLIC PANEL - Abnormal; Notable for the following components:      Result Value   Sodium 130 (*)  Chloride 93 (*)    Glucose, Bld 428 (*)    All other components within normal limits  URINALYSIS, ROUTINE W REFLEX MICROSCOPIC - Abnormal; Notable for the following components:   Color, Urine STRAW (*)    Glucose, UA >=500 (*)    Ketones, ur 20 (*)    All other components within normal limits  CBG MONITORING, ED - Abnormal; Notable for the following components:   Glucose-Capillary 401 (*)    All other components within normal limits  CBG MONITORING, ED - Abnormal; Notable for the following components:   Glucose-Capillary 300 (*)    All other components within normal limits  CBC  ETHANOL  RAPID URINE DRUG SCREEN, HOSP PERFORMED    EKG None  Radiology Dg Chest 2 View  Result Date: 03/18/2018 CLINICAL DATA:  Acute onset of seizures. Status post fall. Confusion. EXAM: CHEST - 2 VIEW COMPARISON:  Chest radiograph performed 02/23/2009 FINDINGS: The lungs are well-aerated and clear. There is no evidence of focal opacification, pleural effusion or pneumothorax. The heart is normal in size; the mediastinal contour is within normal limits. No acute osseous abnormalities are seen. IMPRESSION: No acute cardiopulmonary process seen. No displaced rib fractures identified. Electronically Signed   By: Roanna Raider M.D.   On: 03/18/2018 06:59   Ct Head Wo Contrast  Result Date: 03/18/2018 CLINICAL  DATA:  Seizure EXAM: CT HEAD WITHOUT CONTRAST TECHNIQUE: Contiguous axial images were obtained from the base of the skull through the vertex without intravenous contrast. COMPARISON:  None. FINDINGS: Brain: No evidence of acute infarction, hemorrhage, hydrocephalus, extra-axial collection or mass lesion/mass effect. Vascular: Negative for hyperdense vessel Skull: Negative Sinuses/Orbits: Mild mucosal edema paranasal sinus.  Normal orbit Other: None IMPRESSION: Negative CT head Sinus mucosal disease Electronically Signed   By: Marlan Palau M.D.   On: 03/18/2018 07:23   Mr Laqueta Jean And Wo Contrast  Result Date: 03/18/2018 CLINICAL DATA:  22 year old male with possible seizure this morning. Status post bilateral dental extractions on 03/02/2018, teeth numbers 1, 16, 17, and 32. EXAM: MRI HEAD WITHOUT AND WITH CONTRAST TECHNIQUE: Multiplanar, multiecho pulse sequences of the brain and surrounding structures were obtained without and with intravenous contrast. CONTRAST:  10 milliliters Gadavist COMPARISON:  Head CT without contrast 0703 hours today. FINDINGS: Brain: No restricted diffusion to suggest acute infarction. No midline shift, mass effect, evidence of mass lesion, ventriculomegaly, extra-axial collection or acute intracranial hemorrhage. Cervicomedullary junction and pituitary are within normal limits. On thin slice coronal T2 and FLAIR imaging the hippocampal formations and other mesial temporal lobe structures appear symmetric and within normal limits. Wallace Cullens and white matter signal is within normal limits throughout the brain. No chronic cerebral blood products. No abnormal enhancement identified. No dural thickening. Vascular: Major intracranial vascular flow voids are preserved. The major dural venous sinuses are enhancing and appear to be patent. Skull and upper cervical spine: Negative visible cervical spine. Visualized bone marrow signal is within normal limits. Sinuses/Orbits: Negative orbits. Mild  to moderate mucosal thickening in the right maxillary sinus. Trace left sphenoid mucosal thickening. Hyperplastic sinuses (normal variant). Other: Mastoids are clear. Visible internal auditory structures appear normal. There is asymmetric T2 hyperintensity, soft tissue thickening and enhancement in the right medial masticator space most affecting the pterygoid musculature. There is abnormal heterogeneous enhancement. The right TMJ appears spared. The visible right parapharyngeal space appears relatively spared although there is some asymmetric right retro maxillary fat stranding, and associated abnormal enhancement in the right pterygoid palatine fossa.  IMPRESSION: 1. Normal MRI appearance of the brain. 2. Confluent edema and abnormal enhancement in the medial right masticator space with small areas of pterygoid intramuscular necrosis or possibly abscess (series 12, image 4). Perhaps this is related to the recent dental extraction. There is secondary appearing inflammation of the right maxillary sinus Electronically Signed   By: Odessa FlemingH  Hall M.D.   On: 03/18/2018 12:43    Procedures Procedures (including critical care time)  CRITICAL CARE Performed by: Dietrich PatesHina Melenda Bielak   Total critical care time: 45 minutes  Critical care time was exclusive of separately billable procedures and treating other patients.  Critical care was necessary to treat or prevent imminent or life-threatening deterioration.  Critical care was time spent personally by me on the following activities: development of treatment plan with patient and/or surrogate as well as nursing, discussions with consultants, evaluation of patient's response to treatment, examination of patient, obtaining history from patient or surrogate, ordering and performing treatments and interventions, ordering and review of laboratory studies, ordering and review of radiographic studies, pulse oximetry and re-evaluation of patient's condition.  Medications Ordered in  ED Medications  sodium chloride 0.9 % bolus 1,000 mL (0 mLs Intravenous Stopped 03/18/18 0946)  insulin aspart (novoLOG) injection 10 Units (10 Units Subcutaneous Given 03/18/18 0955)  LORazepam (ATIVAN) 2 MG/ML injection (1 mg  Given 03/18/18 1136)  gadobutrol (GADAVIST) 1 MMOL/ML injection 10 mL (10 mLs Intravenous Contrast Given 03/18/18 1227)     Initial Impression / Assessment and Plan / ED Course  I have reviewed the triage vital signs and the nursing notes.  Pertinent labs & imaging results that were available during my care of the patient were reviewed by me and considered in my medical decision making (see chart for details).    22 year old male presents to ED for possible seizure that occurred prior to arrival.  Mother states that patient is "special needs."  She heard a thud upstairs at her house this morning and found the patient laying down, with his head slammed against a wall.  She does note that he had one prior seizure in his life at the age of 2 to 3 months.  He has never been on any antiepileptic medication.  On my exam patient is alert to self and mother at bedside.  No deficits on neurological exam noted otherwise.  He denies any alcohol or other drug use.  He does not remember anything since going to bed last night and waking up in the ambulance.  Plan is to obtain lab work, imaging and reassess.  Clinical Course as of Mar 19 1503  Thu Mar 18, 2018  0600 Glucose(!): 428 [HK]  0600 Anion gap: 15 [HK]  0730 CT head unremarkable. CXR unremarkable.   [HK]  0830 Spoke to Dr. Amada JupiterKirkpatrick of neurology.  He recommends that we observe patient until 10:30 AM for returning to baseline.  In the meantime, will treat his hyperglycemia with insulin and fluids.   [HK]  1006 Patient with improvement in his mental status.  He is requesting something to eat however, I feel that he will need to wait as he is just received his insulin.  Patient requesting discharge.   [HK]  1017 Patient  continues to request a soda.  He does not want to stay to have blood sugar rechecked.  He will be signing out AMA. He appears back to baseline neurologically, per mother, who is also requesting to go home.   [HK]  1032 As patient was in  process of leaving for home, he had another seizure as witnessed by mother. Ativan given. Mother agrees to stay for admission.   [HK]  1043 Dr. Amada Jupiter will evaluate the patient.  He asked that we order the MRI and EEG.   [HK]  1504 MRI with no acute intracranial abnormality.  There is possible abscess in the right medial masticator space.  EEG is completed.  We will reconsult neurology for recommendations.   [HK]    Clinical Course User Index [HK] Dietrich Pates, PA-C   Will admit to hospitalist for observation. Neurology to consult.  Final Clinical Impressions(s) / ED Diagnoses   Final diagnoses:  Hyperglycemia  Seizure [facility])    ED Discharge Orders         Ordered    Ambulatory referral to Neurology    Comments:  An appointment is requested in approximately: 1 week   03/18/18 1016              Dietrich Pates, PA-C 03/18/18 1535    Long, Arlyss Repress, MD 03/18/18 2049

## 2018-03-18 NOTE — ED Notes (Signed)
Pt and patient's mother aware of need for urine sample. Urinal at the bedside.

## 2018-03-18 NOTE — H&P (Signed)
History and Physical    Mason Pratt ZOX:[id number] DOB: 08-01-1995 DOA: 03/18/2018  PCP: System, Provider Not In  Patient coming from: Home.   I have personally briefly reviewed patient's old medical records in [facility]  Chief Complaint: seizures.   HPI: Mason Pratt is a 22 y.o. male with medical history significant of  Type 2 DM diag 2 to 3 year ago, hypertension and hyperlipidemia, had dental extraction on 12/10 , was brought in to the hospital by his mother after she was found him having seizures for a few minutes. After arrival to ED, pt wanted to leave AMA, but had another second seizure today and subsequently, patient agreed to admit for evaluation.  Pt is lethargic and sleepy, and most of the history was available from th emother at bedside.  As per the mother, pt had developmental delays and was in high school till 8 th grade and is on disability. No fevers or chills. Pt reported pain in the right jaw.  No other complaints.   ED Course: lab work remarkable for elevated cbgs' in 400's , sodium of 130, and UA Is negative. MRI brain  Unremarkable. Except for Confluent edema and abnormal enhancement in the medial right masticator space with small areas of pterygoid intramuscular necrosis or possibly abscess (series 12, image 4). Perhaps this is related to the recent dental extraction. There is secondary appearing inflammation of the right maxillary sinus Review of Systems:couldn't be obtained as pt is lethargic.  Past Medical History:  Diagnosis Date  . Diabetes mellitus without complication (HCC)   . Hyperlipidemia   . Hypertension     Past Surgical History:  Procedure Laterality Date  . TOOTH EXTRACTION Bilateral 03/02/2018   Procedure: DENTAL RESTORATION/EXTRACTIONS;  Surgeon: Ocie Doyne, DDS;  Location: [facility] OR;  Service: Oral Surgery;  Laterality: Bilateral;     reports that he has never smoked. He has never used smokeless tobacco. He reports that he does not  drink alcohol or use drugs.  No Known Allergies  Family history couldn't be obtained due to patients lethargy and mother is not aware of seizures history.   Prior to Admission medications   Medication Sig Start Date End Date Taking? Authorizing Provider  chlorhexidine (PERIDEX) 0.12 % solution 15 mLs by Mouth Rinse route 2 (two) times daily. 02/15/18  Yes [provider]  ibuprofen (ADVIL,MOTRIN) 800 MG tablet Take 1 tablet (800 mg total) by mouth 3 (three) times daily. 03/16/18  Yes Khatri, Hina, PA-C  lidocaine (XYLOCAINE) 2 % solution Use as directed 15 mLs in the mouth or throat as needed for mouth pain. 03/16/18  Yes Khatri, Hina, PA-C  amoxicillin (AMOXIL) 500 MG capsule Take 1 capsule (500 mg total) by mouth 3 (three) times daily. Patient not taking: Reported on 03/18/2018 03/02/18   Ocie Doyne, DDS  insulin glargine (LANTUS) 100 UNIT/ML injection Inject 0.35 mLs (35 Units total) into the skin at bedtime. Patient not taking: Reported on 03/01/2018 04/13/17   Lorre Nick, MD  Lancets (ACCU-CHEK MULTICLIX) lancets Use as instructed 04/13/17   Lorre Nick, MD  metFORMIN (GLUCOPHAGE) 500 MG tablet Take 1 tablet (500 mg total) by mouth 2 (two) times daily with a meal. Patient not taking: Reported on 03/01/2018 04/13/17   Lorre Nick, MD  oxyCODONE-acetaminophen (PERCOCET) 5-325 MG tablet Take 1 tablet by mouth every 4 (four) hours as needed. Patient not taking: Reported on 03/18/2018 03/02/18   Ocie Doyne, DDS    Physical Exam: Vitals:  03/18/18 1115 03/18/18 1235 03/18/18 1530 03/18/18 1614  BP: (!) 156/97 (!) 149/101 125/72 (!) 158/95  Pulse: (!) 101 (!) 105  (!) 117  Resp: 20 (!) 23    Temp:    99.3 F (37.4 C)  TempSrc:    Oral  SpO2: 99% 93%  97%  Weight:      Height:        Constitutional: NAD, calm, comfortable Vitals:   03/18/18 1115 03/18/18 1235 03/18/18 1530 03/18/18 1614  BP: (!) 156/97 (!) 149/101 125/72 (!) 158/95  Pulse: (!) 101 (!) 105   (!) 117  Resp: 20 (!) 23    Temp:    99.3 F (37.4 C)  TempSrc:    Oral  SpO2: 99% 93%  97%  Weight:      Height:        ENMT: Mucous membranes are moist Neck: normal, supple, no masses, no thyromegaly Respiratory: clear to auscultation bilaterally, no wheezing, no crackles. Normal respiratory effort.  Cardiovascular: Regular rate and rhythm, no murmurs /No extremity edema. Abdomen: no tenderness, no masses palpated. Soft , bs+ Musculoskeletal: no clubbing / cyanosis. No joint deformity upper and lower extremities. Skin: no rashes, lesions, ulcers. No induration Neurologic: lethargic, sleepy, able to move all extremities spontaneously.  Psychiatric: lethargic, sleepy, couldn't be assessed.   Labs on Admission: I have personally reviewed following labs and imaging studies  CBC: Recent Labs  Lab 03/18/18 0600  WBC 8.5  HGB 13.9  HCT 41.0  MCV 84.7  PLT 313   Basic Metabolic Panel: Recent Labs  Lab 03/18/18 0600  NA 130*  K 3.9  CL 93*  CO2 22  GLUCOSE 428*  BUN 10  CREATININE 1.10  CALCIUM 9.0   GFR: Estimated Creatinine Clearance: 178.2 mL/min (by C-G formula based on SCr of 1.1 mg/dL). Liver Function Tests: No results for input(s): AST, ALT, ALKPHOS, BILITOT, PROT, ALBUMIN in the last 168 hours. No results for input(s): LIPASE, AMYLASE in the last 168 hours. No results for input(s): AMMONIA in the last 168 hours. Coagulation Profile: No results for input(s): INR, PROTIME in the last 168 hours. Cardiac Enzymes: No results for input(s): CKTOTAL, CKMB, CKMBINDEX, TROPONINI in the last 168 hours. BNP (last 3 results) No results for input(s): PROBNP in the last 8760 hours. HbA1C: No results for input(s): HGBA1C in the last 72 hours. CBG: Recent Labs  Lab 03/18/18 0602 03/18/18 1041 03/18/18 1524  GLUCAP 401* 300* 180*   Lipid Profile: No results for input(s): CHOL, HDL, LDLCALC, TRIG, CHOLHDL, LDLDIRECT in the last 72 hours. Thyroid Function Tests: No  results for input(s): TSH, T4TOTAL, FREET4, T3FREE, THYROIDAB in the last 72 hours. Anemia Panel: No results for input(s): VITAMINB12, FOLATE, FERRITIN, TIBC, IRON, RETICCTPCT in the last 72 hours. Urine analysis:    Component Value Date/Time   COLORURINE STRAW (A) 03/18/2018 0859   APPEARANCEUR CLEAR 03/18/2018 0859   LABSPEC 1.025 03/18/2018 0859   PHURINE 5.0 03/18/2018 0859   GLUCOSEU >=500 (A) 03/18/2018 0859   HGBUR NEGATIVE 03/18/2018 0859   BILIRUBINUR NEGATIVE 03/18/2018 0859   KETONESUR 20 (A) 03/18/2018 0859   PROTEINUR NEGATIVE 03/18/2018 0859   UROBILINOGEN 0.2 01/14/2007 1021   NITRITE NEGATIVE 03/18/2018 0859   LEUKOCYTESUR NEGATIVE 03/18/2018 0859    Radiological Exams on Admission: Dg Chest 2 View  Result Date: 03/18/2018 CLINICAL DATA:  Acute onset of seizures. Status post fall. Confusion. EXAM: CHEST - 2 VIEW COMPARISON:  Chest radiograph performed 02/23/2009 FINDINGS: The lungs are  well-aerated and clear. There is no evidence of focal opacification, pleural effusion or pneumothorax. The heart is normal in size; the mediastinal contour is within normal limits. No acute osseous abnormalities are seen. IMPRESSION: No acute cardiopulmonary process seen. No displaced rib fractures identified. Electronically Signed   By: Roanna RaiderJeffery  Chang M.D.   On: 03/18/2018 06:59   Ct Head Wo Contrast  Result Date: 03/18/2018 CLINICAL DATA:  Seizure EXAM: CT HEAD WITHOUT CONTRAST TECHNIQUE: Contiguous axial images were obtained from the base of the skull through the vertex without intravenous contrast. COMPARISON:  None. FINDINGS: Brain: No evidence of acute infarction, hemorrhage, hydrocephalus, extra-axial collection or mass lesion/mass effect. Vascular: Negative for hyperdense vessel Skull: Negative Sinuses/Orbits: Mild mucosal edema paranasal sinus.  Normal orbit Other: None IMPRESSION: Negative CT head Sinus mucosal disease Electronically Signed   By: Marlan Palauharles  Clark M.D.   On:  03/18/2018 07:23   Mr Laqueta JeanBrain W And Wo Contrast  Result Date: 03/18/2018 CLINICAL DATA:  22 year old male with possible seizure this morning. Status post bilateral dental extractions on 03/02/2018, teeth numbers 1, 16, 17, and 32. EXAM: MRI HEAD WITHOUT AND WITH CONTRAST TECHNIQUE: Multiplanar, multiecho pulse sequences of the brain and surrounding structures were obtained without and with intravenous contrast. CONTRAST:  10 milliliters Gadavist COMPARISON:  Head CT without contrast 0703 hours today. FINDINGS: Brain: No restricted diffusion to suggest acute infarction. No midline shift, mass effect, evidence of mass lesion, ventriculomegaly, extra-axial collection or acute intracranial hemorrhage. Cervicomedullary junction and pituitary are within normal limits. On thin slice coronal T2 and FLAIR imaging the hippocampal formations and other mesial temporal lobe structures appear symmetric and within normal limits. Wallace CullensGray and white matter signal is within normal limits throughout the brain. No chronic cerebral blood products. No abnormal enhancement identified. No dural thickening. Vascular: Major intracranial vascular flow voids are preserved. The major dural venous sinuses are enhancing and appear to be patent. Skull and upper cervical spine: Negative visible cervical spine. Visualized bone marrow signal is within normal limits. Sinuses/Orbits: Negative orbits. Mild to moderate mucosal thickening in the right maxillary sinus. Trace left sphenoid mucosal thickening. Hyperplastic sinuses (normal variant). Other: Mastoids are clear. Visible internal auditory structures appear normal. There is asymmetric T2 hyperintensity, soft tissue thickening and enhancement in the right medial masticator space most affecting the pterygoid musculature. There is abnormal heterogeneous enhancement. The right TMJ appears spared. The visible right parapharyngeal space appears relatively spared although there is some asymmetric right  retro maxillary fat stranding, and associated abnormal enhancement in the right pterygoid palatine fossa. IMPRESSION: 1. Normal MRI appearance of the brain. 2. Confluent edema and abnormal enhancement in the medial right masticator space with small areas of pterygoid intramuscular necrosis or possibly abscess (series 12, image 4). Perhaps this is related to the recent dental extraction. There is secondary appearing inflammation of the right maxillary sinus Electronically Signed   By: Odessa FlemingH  Hall M.D.   On: 03/18/2018 12:43    EKG: Independently reviewed. Sinus tachycardia.   Assessment/Plan Active Problems:   Seizures (HCC)   Seizures:  Unclear etiology.  Neurology consulted and recommended 500 mg of Depakote BID.  MRI brain and EEG reviewed.  Tele sitter ordered.  Prn ativan.  PT /OT eval in am.    Hypertension;  Prn hydralazine will be ordered.    Type 2 DM Non compliant to medications.  Restart lantus and SSI.  hgba1c is 14.    Dental residual abscess:  Start the patient on UNASYN. No oral surgeon  on call tonight. Please call them in am.  Pain control.     DVT prophylaxis: lovenox.  Code Status: full code.  Family Communication: discussed with mom at bedside.  Disposition Plan: pending clinical improvement and further eval by oral surgeon.  Consults called: Dr Amada Jupiter with Neurology.  Admission status: Obs/ Tele.    Kathlen Mody MD [facility] Pager [phone]   If 7PM-7AM, please contact night-coverage [email or web address] Password [facility]  03/18/2018, 4:33 PM

## 2018-03-18 NOTE — Consult Note (Signed)
Neurology Consultation Reason for Consult: Seizures Referring Physician: Long, J  CC: Seizures  History is obtained from: Patient, mother  HPI: Mason Pratt is a 22 y.o. male with a history of developmental delay who presents with new onset seizures.  He apparently stayed up all night last night, which is not unusual for him and was speaking with his mother in the wee hours of the morning(she had recently woken up).  He then went upstairs, and then she heard a thump when she went up she found him to be having seizure activity.  This continued for approximately 1 to 2 minutes and then he was brought to the emergency department where he continued be confused.  Over the next 2 hours, he gradually improved, and was about to be discharged at which point he had a second seizure.  Mother observed the onset of the swelling, she states that the left arm flexed initially, followed by the right arm and then bilateral leg extension and shaking activity.  He has since been improving.  Of note, he has recently been treated for a possible dental abscess with amoxicillin and he recently underwent tooth extraction.  ROS: A 14 point ROS was performed and is negative except as noted in the HPI.   Past Medical History:  Diagnosis Date  . Diabetes mellitus without complication (HCC)   . Hyperlipidemia   . Hypertension    Family history: Both he and his brother are "special needs"  Social History:  reports that he has never smoked. He has never used smokeless tobacco. He reports that he does not drink alcohol or use drugs.   Exam: Current vital signs: BP 125/72   Pulse (!) 105   Temp 98.2 F (36.8 C) (Oral)   Resp (!) 23   Ht 6\' 8"  (2.032 m)   Wt (!) 155 kg   SpO2 93%   BMI 37.54 kg/m  Vital signs in last 24 hours: Temp:  [98.2 F (36.8 C)] 98.2 F (36.8 C) (12/26 0556) Pulse Rate:  [73-110] 105 (12/26 1235) Resp:  [12-24] 23 (12/26 1235) BP: (125-156)/(68-106) 125/72 (12/26 1530) SpO2:   [93 %-99 %] 93 % (12/26 1235) Weight:  [155 kg] 155 kg (12/26 0557)   Physical Exam  Constitutional: Appears well-developed and well-nourished.  Psych: Affect appropriate to situation Eyes: No scleral injection HENT: No OP obstrucion Head: Normocephalic.  Cardiovascular: Normal rate and regular rhythm.  Respiratory: Effort normal, non-labored breathing GI: Soft.  No distension. There is no tenderness.  Skin: WDI  Neuro: Mental Status: Patient is awake, alert, he is able to tell me that he is in the hospital, does not know the year, and is only able to give a month with prompting. No signs of aphasia or neglect. Cranial Nerves: II: Visual Fields are full. Pupils are equal, round, and reactive to light.   III,IV, VI: EOMI without ptosis or diploplia.  V: Facial sensation is symmetric to temperature VII: Facial movement is symmetric.  VIII: hearing is intact to voice X: Uvula elevates symmetrically XI: Shoulder shrug is symmetric. XII: tongue is midline without atrophy or fasciculations.  Motor: Tone is normal. Bulk is normal. 5/5 strength was present in all four extremities.  Sensory: Sensation is symmetric to light touch and temperature in the arms and legs. Cerebellar: No clear ataxia  I have reviewed labs in epic and the results pertinent to this consultation are: Mildly low sodium at 130 Glucose 401  I have reviewed the images obtained: MRI  brain-normal  Impression: 22 year old male with a history of diabetes who presents with new onset seizures.  He has multiple things that could be contributing to her lower seizure threshold including hyperglycemia, amoxicillin, infection (dental).  With the focal nature, I do wonder if he has some underlying seizure predisposition, though I would be hesitant to commit him to lifelong therapy at this time.  With a focal finding on EEG and focal description of the onset of seizures, I would favor starting an antiepileptic least in the  short-term.  Recommendations: 1) Depakote 500 mg twice daily 2) MRI and EEG initially recommended, and they had been completed by the time of finalizing this note.  3) Will follow.   Ritta SlotMcNeill Kirkpatrick, MD [facility] [phone]  If 7pm- 7am, please page neurology on call as listed in AMION.

## 2018-03-18 NOTE — ED Notes (Signed)
Patient transported to X-ray 

## 2018-03-18 NOTE — ED Notes (Signed)
This RN heard patient's mother yelling "HELP ME!! HELP ME!!" This RN hurried to room to see patient seizing in bed. Staff turned patient's head to the side and provided oxygen support during seizure. Seizure lasted approximately 1 minute and was grand mal in nature. Shortly after seizure, patient responsive and agitated. Given 1mg  Ativan IV at 1030.

## 2018-03-18 NOTE — ED Notes (Signed)
This RN instructed patient's mother that the patient may not get out of the bed to go the bathroom in case he has another seizure. Mother verbalized understanding to this RN. A few minutes later, RN observed mother attempting to get patient out of bed. This RN told patient that this wasn't allowed and that the patient needed to use the urinal in the bed due to risk for another seizure and risk of falling. Pt and mother continued to walk towards the restroom. Security called to room. Patient safely ambulated from room to restroom and back. This RN explained once more that the patient is not to get out of bed. Mother once more verbalized understanding and apologized for behavior.

## 2018-03-18 NOTE — ED Notes (Signed)
Patient transported to CT 

## 2018-03-18 NOTE — Progress Notes (Signed)
EEG complete - results pending 

## 2018-03-19 DIAGNOSIS — K047 Periapical abscess without sinus: Secondary | ICD-10-CM | POA: Diagnosis not present

## 2018-03-19 DIAGNOSIS — R569 Unspecified convulsions: Secondary | ICD-10-CM | POA: Diagnosis not present

## 2018-03-19 DIAGNOSIS — R739 Hyperglycemia, unspecified: Secondary | ICD-10-CM | POA: Diagnosis not present

## 2018-03-19 DIAGNOSIS — I10 Essential (primary) hypertension: Secondary | ICD-10-CM | POA: Diagnosis not present

## 2018-03-19 LAB — CBC
HCT: 41.5 % (ref 39.0–52.0)
Hemoglobin: 14.1 g/dL (ref 13.0–17.0)
MCH: 28.6 pg (ref 26.0–34.0)
MCHC: 34 g/dL (ref 30.0–36.0)
MCV: 84.2 fL (ref 80.0–100.0)
PLATELETS: 343 10*3/uL (ref 150–400)
RBC: 4.93 MIL/uL (ref 4.22–5.81)
RDW: 13 % (ref 11.5–15.5)
WBC: 16 10*3/uL — ABNORMAL HIGH (ref 4.0–10.5)
nRBC: 0 % (ref 0.0–0.2)

## 2018-03-19 LAB — BASIC METABOLIC PANEL
Anion gap: 12 (ref 5–15)
BUN: 7 mg/dL (ref 6–20)
CO2: 23 mmol/L (ref 22–32)
Calcium: 8.7 mg/dL — ABNORMAL LOW (ref 8.9–10.3)
Chloride: 101 mmol/L (ref 98–111)
Creatinine, Ser: 0.85 mg/dL (ref 0.61–1.24)
GFR calc Af Amer: >60 mL/min (ref >60)
GLUCOSE: 297 mg/dL — AB (ref 70–99)
Potassium: 3.4 mmol/L — ABNORMAL LOW (ref 3.5–5.1)
Sodium: 136 mmol/L (ref 135–145)

## 2018-03-19 LAB — HIV ANTIBODY (ROUTINE TESTING W REFLEX): HIV Screen 4th Generation wRfx: NONREACTIVE

## 2018-03-19 LAB — GLUCOSE, CAPILLARY: GLUCOSE-CAPILLARY: 336 mg/dL — AB (ref 70–99)

## 2018-03-19 MED ORDER — METFORMIN HCL 500 MG PO TABS: 500.0000 mg | ORAL_TABLET | Freq: Two times a day (BID) | ORAL | 0 refills | Status: AC

## 2018-03-19 MED ORDER — INSULIN GLARGINE 100 UNIT/ML ~~LOC~~ SOLN: 30.0000 [IU] | Freq: Every day | SUBCUTANEOUS | 0 refills | Status: DC

## 2018-03-19 MED ORDER — CLINDAMYCIN HCL 300 MG PO CAPS: 600.0000 mg | ORAL_CAPSULE | Freq: Three times a day (TID) | ORAL | 0 refills | Status: AC

## 2018-03-19 MED ORDER — POTASSIUM CHLORIDE CRYS ER 20 MEQ PO TBCR
40.0000 meq | EXTENDED_RELEASE_TABLET | Freq: Once | ORAL | Status: AC
  Administered 2018-03-19: 40 meq via ORAL
  Filled 2018-03-19: qty 2

## 2018-03-19 MED ORDER — DIVALPROEX SODIUM 500 MG PO DR TAB: 500.0000 mg | DELAYED_RELEASE_TABLET | Freq: Two times a day (BID) | ORAL | 0 refills | Status: DC

## 2018-03-19 MED ORDER — INSULIN ASPART 100 UNIT/ML ~~LOC~~ SOLN
20.0000 [IU] | Freq: Once | SUBCUTANEOUS | Status: AC
  Administered 2018-03-19: 20 [IU] via SUBCUTANEOUS

## 2018-03-19 MED ORDER — HYDROCODONE-ACETAMINOPHEN 5-325 MG PO TABS
2.0000 | ORAL_TABLET | Freq: Once | ORAL | Status: AC
  Administered 2018-03-19: 2 via ORAL
  Filled 2018-03-19: qty 2

## 2018-03-19 MED ORDER — BLOOD GLUCOSE MONITOR KIT: PACK | 1 refills | Status: AC

## 2018-03-19 MED ORDER — "INSULIN SYRINGE-NEEDLE U-100 25G X 1"" 1 ML MISC": 0 refills | Status: AC

## 2018-03-19 MED ORDER — INSULIN ASPART 100 UNIT/ML ~~LOC~~ SOLN: SUBCUTANEOUS | 0 refills | Status: DC

## 2018-03-19 NOTE — Discharge Summary (Signed)
Mason Pratt TXH:[id number] DOB: [birth year] DOA: 03/18/2018  PCP: System, Provider Not In  Admit date: 03/18/2018  Discharge date: 03/19/2018  Admitted From: Home  Disposition:  [place]   Recommendations for Outpatient Follow-up:   Follow up with PCP in 1-2 weeks  PCP Please obtain BMP/CBC, 2 view CXR in 1week,  (see Discharge instructions)   PCP Please follow up on the following pending results: Monitor Glycemic control   Home Health: None Equipment/Devices: None  Consultations: Neuro Discharge Condition: Stable   CODE STATUS: Full   Diet Recommendation: Heart Healthy Low Carb     Chief Complaint  Patient presents with  . Seizures     Brief history of present illness from the day of admission and additional interim summary    Mason Pratt is a 22 y.o. male with medical history significant of  Type 2 DM diag 2 to 3 year ago, hypertension and hyperlipidemia, had dental extraction on 12/10 , was brought in to the hospital by his mother after she was found him having seizures for a few minutes. After arrival to ED, pt wanted to leave AMA, but had another second seizure today and subsequently, patient agreed to admit for evaluation.                                                                  Hospital Course    1. New onset focal seizure.  Likely due to combination of poorly controlled DM type II due to noncompliance with insulin, recent tooth infection requiring extraction, amoxicillin lowering seizure threshold.  Had a thorough work-up with MRI and EEG were stable, case discussed with neurologist Dr. Leonel Ramsay on 03/19/2018, patient will be discharged home on Depakote 1 month supply with close outpatient follow-up with PCP and neurology.  He does not drive already but has been told not to drive for 6  months in case he plans to get a license and drive.  Currently seizure-free close to baseline.  2.  DM type II.  Was not taking his insulin for several months, resumed Lantus, sliding scale along with Glucophage.  Testing supplies also provided.  Requested to check CBGs QA CHS.  PCP to monitor.  His DM type II was in poor control due to hyperglycemia upon admission.  Lab Results  Component Value Date   HGBA1C 14.1 (H) 03/18/2018   3.  Recent tooth extraction.  Kindly see CT report.  Stopped amoxicillin due to seizures, replaced with clindamycin for a week, requested to follow-up with dental surgeon within a week.  4.  Hypokalemia.  Replaced.    Discharge diagnosis     Active Problems:   Seizures ([place])   DM (diabetes mellitus), type 2 ([place])   Hyponatremia   Dental abscess   Essential hypertension  Discharge instructions    Discharge Instructions    Ambulatory referral to Neurology   Complete by:  As directed    An appointment is requested in approximately: 1 week   Discharge instructions   Complete by:  As directed    Follow with Primary MD in 7 days   Get CBC, CMP, 2 view Chest X ray -  checked  by Primary MD  in 5-7 days    Activity: As tolerated with Full fall precautions use walker/cane & assistance as needed  Disposition Home    Diet: Heart Healthy - Low Carb.   Accuchecks 4 times/day, Once in AM empty stomach and then before each meal. Log in all results and show them to your Prim.MD in 3 days. If any glucose reading is under 80 or above 300 call your Prim MD immidiately. Follow Low glucose instructions for glucose under 80 as instructed.   Special Instructions: If you have smoked or chewed Tobacco  in the last 2 yrs please stop smoking, stop any regular Alcohol  and or any Recreational drug use.  On your next visit with your primary care physician please Get Medicines reviewed and adjusted.  Please request your Prim.MD to go over all Hospital Tests and  Procedure/Radiological results at the follow up, please get all Hospital records sent to your Prim MD by signing hospital release before you go home.  If you experience worsening of your admission symptoms, develop shortness of breath, life threatening emergency, suicidal or homicidal thoughts you must seek medical attention immediately by calling 911 or calling your MD immediately  if symptoms less severe.  You Must read complete instructions/literature along with all the possible adverse reactions/side effects for all the Medicines you take and that have been prescribed to you. Take any new Medicines after you have completely understood and accpet all the possible adverse reactions/side effects.   Do not drive, operate heavy machinery, perform activities at heights, swimming or participation in water activities or provide baby sitting services until you have seen by Primary MD or a Neurologist and advised to do so again.   Increase activity slowly   Complete by:  As directed       Discharge Medications   Allergies as of 03/19/2018   No Known Allergies     Medication List    STOP taking these medications   accu-chek multiclix lancets   amoxicillin 500 MG capsule Commonly known as:  AMOXIL     TAKE these medications   blood glucose meter kit and supplies Kit Dispense based on patient and insurance preference. Use up to four times daily as directed. (FOR ICD-9 250.00, 250.01). For QAC - HS accuchecks.   chlorhexidine 0.12 % solution Commonly known as:  PERIDEX 15 mLs by Mouth Rinse route 2 (two) times daily.   clindamycin 300 MG capsule Commonly known as:  CLEOCIN Take 2 capsules (600 mg total) by mouth 3 (three) times daily for 7 days.   divalproex 500 MG DR tablet Commonly known as:  DEPAKOTE Take 1 tablet (500 mg total) by mouth every 12 (twelve) hours.   ibuprofen 800 MG tablet Commonly known as:  ADVIL,MOTRIN Take 1 tablet (800 mg total) by mouth 3 (three) times  daily.   insulin aspart 100 UNIT/ML injection Commonly known as:  NOVOLOG Substitute to any brand approved.Before each meal 3 times a day, 140-199 - 2 units, 200-250 - 4 units, 251-299 - 6 units,  300-349 - 8 units,  350 or above  10 units. Dispense syringes and needles as needed, Ok to switch to PEN if approved. DX DM2, Code E11.65   insulin glargine 100 UNIT/ML injection Commonly known as:  LANTUS Inject 0.3 mLs (30 Units total) into the skin at bedtime. Dispense insulin pen if approved, if not dispense as needed syringes and needles for 1 month supply. Can switch to Levemir. Diagnosis E 11.65. What changed:    how much to take  additional instructions   Insulin Syringe-Needle U-100 25G X 1" 1 ML Misc For 4 times a day insulin SQ, 1 month supply. Diagnosis E11.65   lidocaine 2 % solution Commonly known as:  XYLOCAINE Use as directed 15 mLs in the mouth or throat as needed for mouth pain.   metFORMIN 500 MG tablet Commonly known as:  GLUCOPHAGE Take 1 tablet (500 mg total) by mouth 2 (two) times daily with a meal.   oxyCODONE-acetaminophen 5-325 MG tablet Commonly known as:  PERCOCET Take 1 tablet by mouth every 4 (four) hours as needed.       Follow-up Information    [place]. Schedule an appointment as soon as possible for a visit in 1 week(s).   Why:  Also follow with your dental surgeon within a week and your pain clinic within a week Contact information: [place] [address] [phone]       [facility]. Schedule an appointment as soon as possible for a visit in 1 week(s).   Specialty:  Neurology Contact information: [address] [place] [phone]          Major procedures and Radiology Reports - PLEASE review detailed and final reports thoroughly  -     EEG  1) Focal irregular right frontotemporal delta activity  Clinical  Interpretation: This EEG is consistent with a focal area of cerebral disturbance in the right frontotemporal region.  Though nonspecific, this can be seen as a postictal finding.   There was no seizure or definite seizure predisposition recorded on this study. Please note that lack of epileptiform activity on EEG does not preclude the possibility of epilepsy.    Dg Chest 2 View  Result Date: 03/18/2018 CLINICAL DATA:  Acute onset of seizures. Status post fall. Confusion. EXAM: CHEST - 2 VIEW COMPARISON:  Chest radiograph performed 02/23/2009 FINDINGS: The lungs are well-aerated and clear. There is no evidence of focal opacification, pleural effusion or pneumothorax. The heart is normal in size; the mediastinal contour is within normal limits. No acute osseous abnormalities are seen. IMPRESSION: No acute cardiopulmonary process seen. No displaced rib fractures identified. Electronically Signed   By: Garald Balding M.D.   On: 03/18/2018 06:59   Ct Head Wo Contrast  Result Date: 03/18/2018 CLINICAL DATA:  Seizure EXAM: CT HEAD WITHOUT CONTRAST TECHNIQUE: Contiguous axial images were obtained from the base of the skull through the vertex without intravenous contrast. COMPARISON:  None. FINDINGS: Brain: No evidence of acute infarction, hemorrhage, hydrocephalus, extra-axial collection or mass lesion/mass effect. Vascular: Negative for hyperdense vessel Skull: Negative Sinuses/Orbits: Mild mucosal edema paranasal sinus.  Normal orbit Other: None IMPRESSION: Negative CT head Sinus mucosal disease Electronically Signed   By: Franchot Gallo M.D.   On: 03/18/2018 07:23   Mr Jeri Cos And Wo Contrast  Result Date: 03/18/2018 CLINICAL DATA:  22 year old male with possible seizure this morning. Status post bilateral dental extractions on 03/02/2018, teeth numbers 1, 16, 17, and 32. EXAM: MRI  HEAD WITHOUT AND WITH CONTRAST TECHNIQUE: Multiplanar, multiecho pulse sequences of the brain and surrounding structures  were obtained without and with intravenous contrast. CONTRAST:  10 milliliters Gadavist COMPARISON:  Head CT without contrast 0703 hours today. FINDINGS: Brain: No restricted diffusion to suggest acute infarction. No midline shift, mass effect, evidence of mass lesion, ventriculomegaly, extra-axial collection or acute intracranial hemorrhage. Cervicomedullary junction and pituitary are within normal limits. On thin slice coronal T2 and FLAIR imaging the hippocampal formations and other mesial temporal lobe structures appear symmetric and within normal limits. Pearline Cables and white matter signal is within normal limits throughout the brain. No chronic cerebral blood products. No abnormal enhancement identified. No dural thickening. Vascular: Major intracranial vascular flow voids are preserved. The major dural venous sinuses are enhancing and appear to be patent. Skull and upper cervical spine: Negative visible cervical spine. Visualized bone marrow signal is within normal limits. Sinuses/Orbits: Negative orbits. Mild to moderate mucosal thickening in the right maxillary sinus. Trace left sphenoid mucosal thickening. Hyperplastic sinuses (normal variant). Other: Mastoids are clear. Visible internal auditory structures appear normal. There is asymmetric T2 hyperintensity, soft tissue thickening and enhancement in the right medial masticator space most affecting the pterygoid musculature. There is abnormal heterogeneous enhancement. The right TMJ appears spared. The visible right parapharyngeal space appears relatively spared although there is some asymmetric right retro maxillary fat stranding, and associated abnormal enhancement in the right pterygoid palatine fossa. IMPRESSION: 1. Normal MRI appearance of the brain. 2. Confluent edema and abnormal enhancement in the medial right masticator space with small areas of pterygoid intramuscular necrosis or possibly abscess (series 12, image 4). Perhaps this is related to the  recent dental extraction. There is secondary appearing inflammation of the right maxillary sinus Electronically Signed   By: Genevie Ann M.D.   On: 03/18/2018 12:43    Micro Results     No results found for this or any previous visit (from the past 240 hour(s)).  Today   Subjective    Mason Pratt today has no headache,no chest abdominal pain,no new weakness tingling or numbness, feels much better wants to go home today.     Objective   Blood pressure 134/77, pulse (!) 102, temperature 98.8 F (37.1 C), temperature source Oral, resp. rate 19, height '6\' 8"'$  (2.032 m), weight (!) 155 kg, SpO2 92 %.   Intake/Output Summary (Last 24 hours) at 03/19/2018 0922 Last data filed at 03/19/2018 0643 Gross per 24 hour  Intake 720 ml  Output 4300 ml  Net -3580 ml    Exam  Awake Alert, Oriented x 2, No new F.N deficits, Normal affect [place].AT,PERRAL Supple Neck,No JVD, No cervical lymphadenopathy appriciated.  Symmetrical Chest wall movement, Good air movement bilaterally, CTAB RRR,No Gallops,Rubs or new Murmurs, No Parasternal Heave +ve B.Sounds, Abd Soft, Non tender, No organomegaly appriciated, No rebound -guarding or rigidity. No Cyanosis, Clubbing or edema, No new Rash or bruise   Data Review   CBC w Diff:  Lab Results  Component Value Date   WBC 16.0 (H) 03/19/2018   HGB 14.1 03/19/2018   HCT 41.5 03/19/2018   PLT 343 03/19/2018   LYMPHOPCT 26 04/13/2017   MONOPCT 5 04/13/2017   EOSPCT 3 04/13/2017   BASOPCT 0 04/13/2017    CMP:  Lab Results  Component Value Date   NA 136 03/19/2018   K 3.4 (L) 03/19/2018   CL 101 03/19/2018   CO2 23 03/19/2018   BUN 7 03/19/2018   CREATININE 0.85 03/19/2018  PROT 8.7 (H) 04/13/2017   ALBUMIN 4.6 04/13/2017   BILITOT 1.4 (H) 04/13/2017   ALKPHOS 118 04/13/2017   AST 16 04/13/2017   ALT 18 04/13/2017  .   Total Time in preparing paper work, data evaluation and todays exam - 23 minutes  Lala Lund M.D on 03/19/2018 at [place]  [phone]

## 2018-03-19 NOTE — Progress Notes (Signed)
Florian Buffeece A Desilva to be D/C'd per MD order.  Discussed with the patient mother Devino,Ginger  All questions fully answered.  VSS, Skin clean, dry and intact without evidence of skin break down, no evidence of skin tears noted. IV catheter discontinued intact. Site without signs and symptoms of complications. Dressing and pressure applied.  An After Visit Summary was printed and given to the patient. Patient received prescription.  D/c education completed with patient/family including follow up instructions, medication list, d/c activities limitations if indicated, with other d/c instructions as indicated by MD - patient able to verbalize understanding, all questions fully answered.   Patient instructed to return to ED, call 911, or call MD for any changes in condition.   Patient escorted via WC, and D/C home via private auto.   Conley CanalJose A Rivera [facility] 03/19/2018 10:48 AM

## 2018-03-19 NOTE — Care Management Note (Signed)
Case Management Note  Patient Details  Name: Mason Pratt MRN: [id number] Date of Birth: 1995-09-18  Subjective/Objective:   Pt admitted with seizures. He is from home with his mother.  PcP: mother states he has one but cant remember the name. CM encouraged her to get him an appointment with PCP next week for follow up.                 Action/Plan: Pt discharging home with self care. [facility] [address] [facility].  Mother to provide transport home and supervision at home.   Expected Discharge Date:  03/19/18               Expected Discharge Plan:  Home/Self Care  In-House Referral:     Discharge planning Services  CM Consult  Post Acute Care Choice:    Choice offered to:     DME Arranged:    DME Agency:     HH Arranged:    HH Agency:     Status of Service:  Completed, signed off  If discussed at [employer] Length of Stay Meetings, dates discussed:    Additional Comments:  Kermit BaloKelli F Jams Trickett, RN 03/19/2018, 10:51 AM

## 2018-03-19 NOTE — Progress Notes (Addendum)
NEURO HOSPITALIST PROGRESS NOTE   Subjective: Patient awake, alert, NAD.  Mother at bedside. Per mother and RN no further seizure activity.   Exam: Vitals:   03/18/18 2112 03/19/18 0607  BP: 126/77 134/77  Pulse: (!) 109 (!) 102  Resp: 18 19  Temp:  98.8 F (37.1 C)  SpO2: 90% 92%    Physical Exam   HEENT-  Normocephalic, no lesions, without obvious abnormality.  Normal external eye and conjunctiva.   Cardiovascular- S1-S2 audible, pulses palpable throughout   Lungs-no rhonchi or wheezing noted, no excessive working breathing.  Saturations within normal limits on RA Abdomen- All 4 quadrants palpated and nontender Extremities- Warm, dry and intact Musculoskeletal-no joint tenderness, deformity or swelling Skin-warm and dry, no hyperpigmentation, vitiligo, or suspicious lesions   Neuro:  Mental Status: Alert, oriented,name/age/year. Did not know the month  Speech fluent without evidence of aphasia.  Able to follow simple commands without difficulty. Cranial Nerves: II: Visual fields grossly normal,  III,IV, VI: ptosis not present, extra-ocular motions intact bilaterally pupils equal, round, reactive to light and accommodation V,VII: smile symmetric, facial light touch sensation normal bilaterally VIII: hearing normal bilaterally IX,X: uvula risesmidline XI: bilateral shoulder shrug XII: midline tongue extension Motor: Right : Upper extremity   5/5  Left:     Upper extremity   5/5  Lower extremity   5/5   Lower extremity   5/5 Tone and bulk:normal tone throughout; no atrophy noted Sensory: cool temp and light touch intact throughout, bilaterally Deep Tendon Reflexes: 2+ and symmetric biceps, patella Plantars: Right: downgoing   Left: downgoing Cerebellar: normal finger-to-nose, normal rapid alternating movements and normal heel-to-shin test Gait: deferred    Medications:  Scheduled: . divalproex  500 mg Oral Q12H  . enoxaparin (LOVENOX)  injection  80 mg Subcutaneous Q24H  . insulin aspart  0-5 Units Subcutaneous QHS  . insulin aspart  0-9 Units Subcutaneous TID WC  . insulin glargine  15 Units Subcutaneous QHS   Continuous: . ampicillin-sulbactam (UNASYN) IV 3 g (03/19/18 0537)   PRN:  Pertinent Labs/Diagnostics:   Dg Chest 2 View  Result Date: 03/18/2018 CLINICAL DATA:  Acute onset of seizures. Status post fall. Confusion. EXAM: CHEST - 2 VIEW COMPARISON:  Chest radiograph performed 02/23/2009 FINDINGS: The lungs are well-aerated and clear. There is no evidence of focal opacification, pleural effusion or pneumothorax. The heart is normal in size; the mediastinal contour is within normal limits. No acute osseous abnormalities are seen. IMPRESSION: No acute cardiopulmonary process seen. No displaced rib fractures identified. Electronically Signed   By: Roanna RaiderJeffery  Chang M.D.   On: 03/18/2018 06:59   Ct Head Wo Contrast  Result Date: 03/18/2018 CLINICAL DATA:  Seizure EXAM: CT HEAD WITHOUT CONTRAST TECHNIQUE: Contiguous axial images were obtained from the base of the skull through the vertex without intravenous contrast. COMPARISON:  None. FINDINGS: Brain: No evidence of acute infarction, hemorrhage, hydrocephalus, extra-axial collection or mass lesion/mass effect. Vascular: Negative for hyperdense vessel Skull: Negative Sinuses/Orbits: Mild mucosal edema paranasal sinus.  Normal orbit Other: None IMPRESSION: Negative CT head Sinus mucosal disease Electronically Signed   By: Marlan Palauharles  Clark M.D.   On: 03/18/2018 07:23   Mr Laqueta JeanBrain W And Wo Contrast  Result Date: 03/18/2018 CLINICAL DATA:  22 year old male with possible seizure this morning. Status post bilateral dental extractions on 03/02/2018, teeth numbers 1, 16, 17, and 32. EXAM:  MRI HEAD WITHOUT AND WITH CONTRAST TECHNIQUE: Multiplanar, multiecho pulse sequences of the brain and surrounding structures were obtained without and with intravenous contrast. CONTRAST:  10  milliliters Gadavist COMPARISON:  Head CT without contrast 0703 hours today. FINDINGS: Brain: No restricted diffusion to suggest acute infarction. No midline shift, mass effect, evidence of mass lesion, ventriculomegaly, extra-axial collection or acute intracranial hemorrhage. Cervicomedullary junction and pituitary are within normal limits. On thin slice coronal T2 and FLAIR imaging the hippocampal formations and other mesial temporal lobe structures appear symmetric and within normal limits. Wallace Cullens and white matter signal is within normal limits throughout the brain. No chronic cerebral blood products. No abnormal enhancement identified. No dural thickening. Vascular: Major intracranial vascular flow voids are preserved. The major dural venous sinuses are enhancing and appear to be patent. Skull and upper cervical spine: Negative visible cervical spine. Visualized bone marrow signal is within normal limits. Sinuses/Orbits: Negative orbits. Mild to moderate mucosal thickening in the right maxillary sinus. Trace left sphenoid mucosal thickening. Hyperplastic sinuses (normal variant). Other: Mastoids are clear. Visible internal auditory structures appear normal. There is asymmetric T2 hyperintensity, soft tissue thickening and enhancement in the right medial masticator space most affecting the pterygoid musculature. There is abnormal heterogeneous enhancement. The right TMJ appears spared. The visible right parapharyngeal space appears relatively spared although there is some asymmetric right retro maxillary fat stranding, and associated abnormal enhancement in the right pterygoid palatine fossa. IMPRESSION: 1. Normal MRI appearance of the brain. 2. Confluent edema and abnormal enhancement in the medial right masticator space with small areas of pterygoid intramuscular necrosis or possibly abscess (series 12, image 4). Perhaps this is related to the recent dental extraction. There is secondary appearing inflammation of  the right maxillary sinus Electronically Signed   By: Odessa Fleming M.D.   On: 03/18/2018 12:43    Impression: 22 year old male with a history of diabetes who presents with new onset seizures.  He has multiple things that could be contributing to her lower seizure threshold including hyperglycemia, amoxicillin, infection (dental).  With the focal nature, I do wonder if he has some underlying seizure predisposition, though I would be hesitant to commit him to lifelong therapy at this time.  With a focal finding on EEG and focal description of the onset of seizures, I would favor starting an antiepileptic least in the short-term.  Recommendations: --Depakote 500 mg twice daily --F/u with outpatient Neurology ( mom prefers [facility]) --Neurology to sign off at this time. Page with concerns  Valentina Lucks, MSN, NP-C [facility] [phone]  Attending neurologist's note to follow Patient was discharged prior to me seeing him. With focal findings, I would favor continuing his antiepileptics in the short-term at least.  Possibly he may come off in the future, but he will need outpatient neurology follow-up.  Ritta Slot, MD [facility] [phone]  If 7pm- 7am, please page neurology on call as listed in AMION.  03/19/2018, 8:13 AM

## 2018-03-19 NOTE — Progress Notes (Signed)
CSW received consult that patient's mother is requesting a meal voucher. CSW will provide one time voucher.   Osborne Cascoadia Chace Klippel LCSW [phone]

## 2018-03-19 NOTE — Discharge Instructions (Signed)
Follow with Primary MD in 7 days   Get CBC, CMP, 2 view Chest X ray -  checked  by Primary MD  in 5-7 days    Activity: As tolerated with Full fall precautions use walker/cane & assistance as needed  Disposition Home    Diet: Heart Healthy - Low Carb.   Accuchecks 4 times/day, Once in AM empty stomach and then before each meal. Log in all results and show them to your Prim.MD in 3 days. If any glucose reading is under 80 or above 300 call your Prim MD immidiately. Follow Low glucose instructions for glucose under 80 as instructed.   Special Instructions: If you have smoked or chewed Tobacco  in the last 2 yrs please stop smoking, stop any regular Alcohol  and or any Recreational drug use.  On your next visit with your primary care physician please Get Medicines reviewed and adjusted.  Please request your Prim.MD to go over all Hospital Tests and Procedure/Radiological results at the follow up, please get all Hospital records sent to your Prim MD by signing hospital release before you go home.  If you experience worsening of your admission symptoms, develop shortness of breath, life threatening emergency, suicidal or homicidal thoughts you must seek medical attention immediately by calling 911 or calling your MD immediately  if symptoms less severe.  You Must read complete instructions/literature along with all the possible adverse reactions/side effects for all the Medicines you take and that have been prescribed to you. Take any new Medicines after you have completely understood and accpet all the possible adverse reactions/side effects.   Do not drive, operate heavy machinery, perform activities at heights, swimming or participation in water activities or provide baby sitting services until you have seen by Primary MD or a Neurologist and advised to do so again.

## 2018-03-23 LAB — CULTURE, BLOOD (ROUTINE X 2)
Culture: NO GROWTH
Culture: NO GROWTH
Special Requests: ADEQUATE
Special Requests: ADEQUATE

## 2018-03-28 ENCOUNTER — Encounter (HOSPITAL_COMMUNITY): Payer: Self-pay

## 2018-03-28 ENCOUNTER — Emergency Department (HOSPITAL_COMMUNITY)
Admission: EM | Admit: 2018-03-28 | Discharge: 2018-03-28 | Disposition: A | Discharge status: Home / Self Care | Payer: Medicaid Other | Attending: Emergency Medicine | Admitting: Emergency Medicine

## 2018-03-28 DIAGNOSIS — K0889 Other specified disorders of teeth and supporting structures: Secondary | ICD-10-CM | POA: Diagnosis not present

## 2018-03-28 DIAGNOSIS — Z79899 Other long term (current) drug therapy: Secondary | ICD-10-CM | POA: Insufficient documentation

## 2018-03-28 DIAGNOSIS — E119 Type 2 diabetes mellitus without complications: Secondary | ICD-10-CM | POA: Diagnosis not present

## 2018-03-28 DIAGNOSIS — Z794 Long term (current) use of insulin: Secondary | ICD-10-CM | POA: Diagnosis not present

## 2018-03-28 DIAGNOSIS — I10 Essential (primary) hypertension: Secondary | ICD-10-CM | POA: Diagnosis not present

## 2018-03-28 DIAGNOSIS — J45909 Unspecified asthma, uncomplicated: Secondary | ICD-10-CM | POA: Diagnosis not present

## 2018-03-28 MED ORDER — GABAPENTIN 300 MG PO CAPS: 300.0000 mg | ORAL_CAPSULE | Freq: Two times a day (BID) | ORAL | 0 refills | Status: DC

## 2018-03-28 NOTE — ED Provider Notes (Signed)
[facility] DEPT Provider Note   CSN: [id number] Arrival date & time: 03/28/18  1225     History   Chief Complaint Chief Complaint  Patient presents with  . Dental Pain    HPI Mason Pratt is a 23 y.o. male with hx of ADD, DM, HTN and mental disorder presents to the ED with his mother with c/o dental pain. Patient had all 4 wisdom teeth extracted and is taking ibuprofen 800 mg for pain but still c/o pain on the right side in the upper dental area. Patient states it does not hurt where the tooth came out but on the gum area.   HPI  Past Medical History:  Diagnosis Date  . ADD (attention deficit disorder)   . ADHD (attention deficit hyperactivity disorder)   . Arthritis    "knees" (03/18/2018"  . Cheek mass    "noted when he had wisdom teeth taken out 03/02/2018" (03/18/2018)  . Childhood asthma   . Hyperlipidemia   . Hypertension   . Mental disorder    "EMD:  emotional mental disorder; dx'd at age 23 months" (03/18/2018)  . Seizure [facility]) 1997; 03/18/2018   "was told that indicated that he would be a special needs child;"  . Type II diabetes mellitus [facility])     Patient Active Problem List   Diagnosis Date Noted  . Seizures ([place]) 03/18/2018  . DM (diabetes mellitus), type 2 ([place]) 03/18/2018  . Hyponatremia 03/18/2018  . Dental abscess 03/18/2018  . Essential hypertension 03/18/2018    Past Surgical History:  Procedure Laterality Date  . TOOTH EXTRACTION Bilateral 03/02/2018   Procedure: DENTAL RESTORATION/EXTRACTIONS;  Surgeon: Diona Browner, DDS;  Location: [facility];  Service: Oral Surgery;  Laterality: Bilateral;        Home Medications    Prior to Admission medications   Medication Sig Start Date End Date Taking? Authorizing Provider  blood glucose meter kit and supplies KIT Dispense based on patient and insurance preference. Use up to four times daily as directed. (FOR ICD-9 250.00, 250.01). For QAC - HS accuchecks. 03/19/18    Thurnell Lose, MD  chlorhexidine (PERIDEX) 0.12 % solution 15 mLs by Mouth Rinse route 2 (two) times daily. 02/15/18   [provider]  divalproex (DEPAKOTE) 500 MG DR tablet Take 1 tablet (500 mg total) by mouth every 12 (twelve) hours. 03/19/18   Thurnell Lose, MD  ibuprofen (ADVIL,MOTRIN) 800 MG tablet Take 1 tablet (800 mg total) by mouth 3 (three) times daily. 03/16/18   Khatri, Hina, PA-C  insulin aspart (NOVOLOG) 100 UNIT/ML injection Substitute to any brand approved.Before each meal 3 times a day, 140-199 - 2 units, 200-250 - 4 units, 251-299 - 6 units,  300-349 - 8 units,  350 or above 10 units. Dispense syringes and needles as needed, Ok to switch to PEN if approved. DX DM2, Code E11.65 03/19/18   Thurnell Lose, MD  insulin glargine (LANTUS) 100 UNIT/ML injection Inject 0.3 mLs (30 Units total) into the skin at bedtime. Dispense insulin pen if approved, if not dispense as needed syringes and needles for 1 month supply. Can switch to Levemir. Diagnosis E 11.65. 03/19/18   Thurnell Lose, MD  Insulin Syringe-Needle U-100 25G X 1" 1 ML MISC For 4 times a day insulin SQ, 1 month supply. Diagnosis E11.65 03/19/18   Thurnell Lose, MD  lidocaine (XYLOCAINE) 2 % solution Use as directed 15 mLs in the mouth or throat as needed for  mouth pain. 03/16/18   Khatri, Hina, PA-C  metFORMIN (GLUCOPHAGE) 500 MG tablet Take 1 tablet (500 mg total) by mouth 2 (two) times daily with a meal. 03/19/18   Thurnell Lose, MD  oxyCODONE-acetaminophen (PERCOCET) 5-325 MG tablet Take 1 tablet by mouth every 4 (four) hours as needed. Patient not taking: Reported on 03/18/2018 03/02/18   Diona Browner, DDS    Family History History reviewed. No pertinent family history.  Social History Social History   Tobacco Use  . Smoking status: Never Smoker  . Smokeless tobacco: Never Used  Substance Use Topics  . Alcohol use: No  . Drug use: Never     Allergies   Patient has no known  allergies.   Review of Systems Review of Systems  Constitutional: Negative for chills and fever.  HENT: Positive for dental problem.   Respiratory: Negative for cough.   Cardiovascular: Negative for chest pain.  Musculoskeletal: Negative for neck pain.     Physical Exam Updated Vital Signs BP (!) 144/85 (BP Location: Left Arm)   Pulse (!) 114   Temp 98.4 F (36.9 C) (Oral)   Resp 18   Ht '6\' 8"'$  (2.032 m)   Wt (!) 154.2 kg   SpO2 98%   BMI 37.35 kg/m   Physical Exam Vitals signs and nursing note reviewed.  Constitutional:      General: He is not in acute distress.    Appearance: He is well-developed.  HENT:     Head: Normocephalic.     Right Ear: Tympanic membrane normal.     Left Ear: Tympanic membrane normal.     Nose: Nose normal.     Mouth/Throat:     Mouth: Mucous membranes are moist.      Comments: Tenderness to the gum surrounding the right upper dental where the upper third molar was extracted.  Eyes:     Extraocular Movements: Extraocular movements intact.     Conjunctiva/sclera: Conjunctivae normal.  Neck:     Musculoskeletal: Neck supple.  Cardiovascular:     Rate and Rhythm: Normal rate.  Pulmonary:     Effort: Pulmonary effort is normal.  Abdominal:     Palpations: Abdomen is soft.     Tenderness: There is no abdominal tenderness.  Musculoskeletal: Normal range of motion.  Skin:    General: Skin is warm and dry.  Neurological:     Mental Status: He is alert and oriented to person, place, and time.     Cranial Nerves: No cranial nerve deficit.  Psychiatric:        Mood and Affect: Mood normal.      ED Treatments / Results  Labs (all labs ordered are listed, but only abnormal results are displayed) Labs Reviewed - No data to display  Radiology No results found.  Procedures Procedures (including critical care time)  Medications Ordered in ED Medications - No data to display   Initial Impression / Assessment and Plan / ED Course    I have reviewed the triage vital signs and the nursing notes. 23 y.o. male here with pain right upper dental area s/p extraction of wisdom teeth stable for d/c without fever and does not appear toxic. Pain relieved with topical lidocaine gel. Patient to f/u with the dentist that did the extraction tomorrow. Discussed with the patient's mother plan of care and she agrees.   Final Clinical Impressions(s) / ED Diagnoses   Final diagnoses:  Pain, dental    ED Discharge Orders  None       Debroah Baller [place], [place] 03/28/18 1612    Quintella Reichert, MD 03/29/18 [phone]

## 2018-03-28 NOTE — ED Triage Notes (Signed)
Pt presents with c/o dental pain. Pt had his wisdom teeth taken out 3 weeks ago and is now reporting pain on the right side of his mouth.

## 2018-03-28 NOTE — Discharge Instructions (Addendum)
Your dental extraction does not appear infected at this time. Continue to take the ibuprofen and use the topical pain medication as needed. Call the dentist who did the extraction tomorrow for follow up.

## 2018-04-01 ENCOUNTER — Telehealth: Payer: Self-pay | Admitting: Neurology

## 2018-04-01 ENCOUNTER — Ambulatory Visit: Payer: Medicaid Other | Admitting: Neurology

## 2018-04-01 ENCOUNTER — Encounter: Payer: Self-pay | Admitting: Neurology

## 2018-04-01 VITALS — BP 134/88 | HR 118 | Ht >= 80 in | Wt 292.0 lb

## 2018-04-01 DIAGNOSIS — G40001 Localization-related (focal) (partial) idiopathic epilepsy and epileptic syndromes with seizures of localized onset, not intractable, with status epilepticus: Secondary | ICD-10-CM | POA: Diagnosis not present

## 2018-04-01 DIAGNOSIS — G060 Intracranial abscess and granuloma: Secondary | ICD-10-CM | POA: Diagnosis not present

## 2018-04-01 MED ORDER — GABAPENTIN 300 MG PO CAPS: 300.0000 mg | ORAL_CAPSULE | Freq: Two times a day (BID) | ORAL | 4 refills | Status: AC

## 2018-04-01 MED ORDER — GABAPENTIN 300 MG PO CAPS: 300.0000 mg | ORAL_CAPSULE | Freq: Two times a day (BID) | ORAL | 4 refills | Status: DC

## 2018-04-01 MED ORDER — DIVALPROEX SODIUM ER 500 MG PO TB24: 1000.0000 mg | ORAL_TABLET | Freq: Every day | ORAL | 11 refills | Status: AC

## 2018-04-01 NOTE — Patient Instructions (Addendum)
Labwork today Repeat CT of head with and without contrast Stop Depakote IR and start Depakote ER (500mg , 2 pills at bedtime) Primary Care: Call Medicaid   Seizure, Adult A seizure is a sudden burst of abnormal electrical activity in the brain. The abnormal activity temporarily interrupts normal brain function, causing a person to experience any of the following:  Involuntary movements.  Changes in awareness or consciousness.  Uncontrollable shaking (convulsions). Seizures usually last from 30 seconds to 2 minutes. They usually do not cause permanent brain damage unless they are prolonged. What are the causes? This condition may be caused by:  Fever.  Low blood sugar.  Medicine.  Illness.  Brain injury.  Brain tumor.  Stroke.  A condition that is passed from parent to child (genetic).  Addiction to a substance (substanceuse disorder) or suddenly stopping the use of a substance (withdrawal). Some people who have a seizure never have another one. People who have repeated seizures have a condition called epilepsy. What are the signs or symptoms? Symptoms of this condition vary greatly from person to person. They include:  Convulsions.  Stiffening of the body.  Involuntary movements of the arms or legs.  Loss of consciousness.  Breathing problems.  Falling suddenly.  Confusion.  Head nodding.  Eye blinking or fluttering.  Lip smacking or tongue biting.  Drooling.  Rapid eye movements.  Grunting.  Loss of bladder control and bowel control.  Staring.  Unresponsiveness. Some people have symptoms right before a seizure happens (aura) and right after a seizure happens.  Symptoms that may occur before a seizure include: ? Fear or anxiety. ? Nausea. ? Feeling like the room is spinning (vertigo). ? A feeling of having seen or heard something before (dj vu). ? Odd tastes or smells. ? Changes in vision, such as seeing flashing lights or  spots.  Symptoms that may occur after a seizure include: ? Confusion. ? Sleepiness. ? Headache. ? Weakness on one side of the body. How is this diagnosed? This condition may be diagnosed based on medical history and physical exam. You may also have other tests, including:  Blood tests.  Electroencephalogram, EEG.  CT scan.  MRI.  Spinal tap (lumbar puncture). How is this treated? Most seizures will stop on their own in under 5 minutes and no treatment is needed. Seizures lasting longer than 5 minutes will usually need treatment. Treatment includes:  Medicines given through IV.  Avoiding known triggers, such as medicines that you take for another condition.  Medicines to treat epilepsy (antiepileptics), if epilepsy caused your seizures.  Surgery to stop seizures, if you have epilepsy that does not respond to medicines. Follow these instructions at home: Medicines  Take over-the-counter and prescription medicines only as told by your health care provider.  Avoid any substances that may prevent your medicine from working properly, such as alcohol. Activity  Do not drive, swim, or do any other activities that would be dangerous if you had another seizure. Wait until your health care provider says it is safe to do them.  If you live in the [place] check with your local DMV (department of motor vehicles) to find out about local driving laws. Each state has specific rules about when you can legally return to driving.  Get enough rest. Lack of sleep can make seizures more likely to occur. Educating others Teach friends and family what to do if you have a seizure. They should:  Lay you on the ground to prevent a fall.  Cushion  your head and body.  Loosen any tight clothing around your neck.  Turn you on your side. If vomiting occurs, this helps keep your airway clear.  Not hold you down. Holding you down will not stop the seizure.  Not put anything into your  mouth.  Know whether or not you need emergency care.  Stay with you until you recover.  General instructions  Contact your health care provider each time you have a seizure.  Avoid anything that has ever triggered a seizure for you.  Keep a seizure diary. Record what you remember about each seizure, especially anything that might have triggered the seizure.  Keep all follow-up visits as told by your health care provider. This is important. Contact a health care provider if:  You have another seizure.  You have seizures more often.  Your seizure symptoms change.  You continue to have seizures with treatment.  You have symptoms of an infection or illness. These might increase your risk of having a seizure. Get help right away if:  You have a seizure that: ? Lasts longer than 5 minutes. ? Is different than previous seizures. ? Leaves you unable to speak or use a part of your body. ? Makes it harder to breathe.  You have: ? A seizure after a head injury. ? Multiple seizures in a row. ? Confusion or a severe headache right after a seizure.  You are having seizures more often.  You do not wake up immediately after a seizure.  You injure yourself during a seizure. These symptoms may represent a serious problem that is an emergency. Do not wait to see if the symptoms will go away. Get medical help right away. Call your local emergency services (911 in the [place] Do not drive yourself to the hospital. Summary  Seizures are caused by abnormal electrical activity in the brain. The activity disrupts normal brain function, leading to a change in consciousness, abnormal movements, or convulsions.  There are many causes of seizures including illnesses, medicines, genetic conditions, head injuries, strokes, tumors, substance abuse, or substance withdrawal.  Most seizures will stop on their own in under 5 minutes. Seizures lasting longer than 5 minutes are a medical emergency and  require immediate treatment.  There are many medicines that are used to treat seizures. Take over-the-counter and prescription medicines only as told by your health care provider. This information is not intended to replace advice given to you by your health care provider. Make sure you discuss any questions you have with your health care provider. Document Released: 03/07/2000 Document Revised: 04/16/2017 Document Reviewed: 04/16/2017 Elsevier Interactive Patient Education  2019 [employer]

## 2018-04-01 NOTE — Telephone Encounter (Signed)
medicaid order sent to [facility]. They will obtain the auth and reach out to the pt to schedule.  °

## 2018-04-01 NOTE — Progress Notes (Signed)
[facility]    Provider:  Dr Jaynee Eagles Referring Provider: Lulu Riding, ED Primary Care Physician:  Does not have one  CC:  seizures  HPI:  Mason Pratt is a 23 y.o. male here as requested by Dr. Shelly Coss for seizures.  Past medical history diabetes, hyperlipidemia, hypertension, developmentally delayed, noncompliance with medical management, had a seizure as a child. Mother provides all information. When he was 3-4 months old he had a seizure, his brother also had a seizure as an infant. She found him on the floor of his bedroom confused after he stayed up all night. In the ED his eyes were open, his right arm went stiff, then the other one and generalization and tonic clonic. He has being side effects to the Depakote, headaches. Patient said the pain and infection is still in his tooth. He is feeling dizzy like the room is spinning, he has eye pain like a tumor in his head. He is still having pulsating, pulsatile tinnitus. When he gets up he feels dizzy.    Reviewed notes, labs and imaging from outside physicians, which showed:   Reviewed emergency room notes.  Patient's been in the emergency room multiple times since November of this year.  In November of this year he went complaining of right upper dental pain and was diagnosed with periodontitis.  Patient is diabetic apparently but does not check his blood sugar and is not on any medication for diabetes.  Patient has been on antibiotics and had surgery for this in December 2019.  He was seen in the emergency room on the 24th for continued pain.  Patient was seen on 1226 from home with complaints of seizures.  Per mom she heard a thud and found patient on floor in the semi-fetal position tremoring and confused.  Patient is developmentally delayed but mom states alert and oriented x4 on a good day.  Patient had a witnessed seizure in bed that lasted approximately 1 minute and was generalized tonic-clonic, with postictal  agitation given Ativan.  Neurology was consulted, in the setting of staying up all night.  Description included left arm flexed initially followed by right arm and then bilateral leg extension and shaking activity.  Neurology stated multiple contributing factors to lowered seizure threshold including hyperglycemia, amoxicillin, dental infection.  Focal with secondary generalization so likely some underlying seizure predisposition and will continue lifelong therapy at this time.  He was started on Depakote.  MRI and EEG were completed in the hospital.   Reviewed EEG report which was consistent with a focal area of cerebral disturbance in the right frontotemporal region, this can be seen as a postictal finding, there was no seizure or definitive seizure predisposition recorded on this study but this does not preclude the possibility of epilepsy.  MRI brain, reviewed images and agree with the following:  IMPRESSION: 1. Normal MRI appearance of the brain. 2. Confluent edema and abnormal enhancement in the medial right masticator space with small areas of pterygoid intramuscular necrosis or possibly abscess (series 12, image 4). Perhaps this is related to the recent dental extraction. There is secondary appearing inflammation of the right maxillary Sinus   Review of Systems: Patient complains of symptoms per HPI as well as the following symptoms: seizures. Pertinent negatives and positives per HPI. All others negative.   Social History   Socioeconomic History  . Marital status: Single    Spouse name: Not on file  . Number of children: 0  . Years of  education: Not on file  . Highest education level: 8th grade  Occupational History  . Not on file  Social Needs  . Financial resource strain: Not on file  . Food insecurity:    Worry: Not on file    Inability: Not on file  . Transportation needs:    Medical: Not on file    Non-medical: Not on file  Tobacco Use  . Smoking status: Never  Smoker  . Smokeless tobacco: Never Used  Substance and Sexual Activity  . Alcohol use: No  . Drug use: Never  . Sexual activity: Never  Lifestyle  . Physical activity:    Days per week: Not on file    Minutes per session: Not on file  . Stress: Not on file  Relationships  . Social connections:    Talks on phone: Not on file    Gets together: Not on file    Attends religious service: Not on file    Active member of club or organization: Not on file    Attends meetings of clubs or organizations: Not on file    Relationship status: Not on file  . Intimate partner violence:    Fear of current or ex partner: Not on file    Emotionally abused: Not on file    Physically abused: Not on file    Forced sexual activity: Not on file  Other Topics Concern  . Not on file  Social History Narrative   Lives at home with his mother and pets   Right handed   Caffeine: tea, diet sodas all day long     Family History  Problem Relation Age of Onset  . Headache Mother   . Seizures Brother   . Headache Brother   . Alzheimer's disease Other        mat great grandmother, maternal great uncle, great aunts on mother's side   . Fibromyalgia Other        mother's side of the family     Past Medical History:  Diagnosis Date  . ADD (attention deficit disorder)   . ADHD (attention deficit hyperactivity disorder)   . Arthritis    "knees" (03/18/2018"  . Cheek mass    "noted when he had wisdom teeth taken out 03/02/2018" (03/18/2018)  . Childhood asthma   . Hyperlipidemia   . Hypertension   . Mental disorder    "EMD:  emotional mental disorder; dx'd at age 40 months" (03/18/2018)  . Seizure [facility]) 1997; 03/18/2018   "was told that indicated that he would be a special needs child;"  . Type II diabetes mellitus ([place])     Past Surgical History:  Procedure Laterality Date  . TOOTH EXTRACTION Bilateral 03/02/2018   Procedure: DENTAL RESTORATION/EXTRACTIONS;  Surgeon: Diona Browner, DDS;   Location: [place];  Service: Oral Surgery;  Laterality: Bilateral;    Current Outpatient Medications  Medication Sig Dispense Refill  . CLINDAMYCIN HCL PO Take 2 tablets by mouth 2 (two) times daily.    Marland Kitchen gabapentin (NEURONTIN) 300 MG capsule Take 1 capsule (300 mg total) by mouth 2 (two) times daily. 180 capsule 4  . ibuprofen (ADVIL,MOTRIN) 800 MG tablet Take 1 tablet (800 mg total) by mouth 3 (three) times daily. 21 tablet 0  . blood glucose meter kit and supplies KIT Dispense based on patient and insurance preference. Use up to four times daily as directed. (FOR ICD-9 250.00, 250.01). For QAC - HS accuchecks. 1 each 1  . divalproex (DEPAKOTE ER)  500 MG 24 hr tablet Take 2 tablets (1,000 mg total) by mouth daily. 180 tablet 11  . insulin aspart (NOVOLOG) 100 UNIT/ML injection Substitute to any brand approved.Before each meal 3 times a day, 140-199 - 2 units, 200-250 - 4 units, 251-299 - 6 units,  300-349 - 8 units,  350 or above 10 units. Dispense syringes and needles as needed, Ok to switch to PEN if approved. DX DM2, Code E11.65 (Patient not taking: Reported on 04/01/2018) 1 vial 0  . insulin glargine (LANTUS) 100 UNIT/ML injection Inject 0.3 mLs (30 Units total) into the skin at bedtime. Dispense insulin pen if approved, if not dispense as needed syringes and needles for 1 month supply. Can switch to Levemir. Diagnosis E 11.65. (Patient not taking: Reported on 04/01/2018) 10 mL 0  . Insulin Syringe-Needle U-100 25G X 1" 1 ML MISC For 4 times a day insulin SQ, 1 month supply. Diagnosis E11.65 30 each 0  . metFORMIN (GLUCOPHAGE) 500 MG tablet Take 1 tablet (500 mg total) by mouth 2 (two) times daily with a meal. (Patient not taking: Reported on 04/01/2018) 60 tablet 0  . oxyCODONE-acetaminophen (PERCOCET) 5-325 MG tablet Take 1 tablet by mouth every 4 (four) hours as needed. (Patient not taking: Reported on 03/18/2018) 20 tablet 0   No current facility-administered medications for this visit.      Allergies as of 04/01/2018  . (No Known Allergies)    Vitals: BP 134/88 (BP Location: Right Arm, Patient Position: Sitting)   Pulse (!) 118   Ht '6\' 8"'$  (2.032 m)   Wt 292 lb (132.5 kg)   BMI 32.08 kg/m  Last Weight:  Wt Readings from Last 1 Encounters:  04/01/18 292 lb (132.5 kg)   Last Height:   Ht Readings from Last 1 Encounters:  04/01/18 '6\' 8"'$  (2.032 m)   Physical exam: Exam: Gen: NAD, not conversant, obese, poor dentition                  CV: RRR, no MRG. No Carotid Bruits. No peripheral edema, warm, nontender Eyes: Conjunctivae clear without exudates or hemorrhage  Neuro: Detailed Neurologic Exam  Speech:    Speech is fluent.  Cognition:    The patient is oriented to person    recent and remote memory impaired;     language fluent;     Impaired attention, concentration, fund of knowledge Cranial Nerves:    The pupils are equal, round, and reactive to light.  Attempted funduscopic exam could not visualize due to noncooperation.  Visual fields are full to threat. Extraocular movements are intact. Trigeminal sensation is intact and the muscles of mastication are normal. The face is symmetric. The palate elevates in the midline. Hearing intact. Voice is normal. Shoulder shrug is normal. The tongue has normal motion without fasciculations.   Coordination:    No dysmetria  Gait:    No ataxia  Motor Observation:    No asymmetry, no atrophy, and no involuntary movements noted. Tone:    Normal muscle tone.    Posture:    Posture is normal. normal erect    Strength:    Strength is symmetric in the upper and lower limbs without apparent focal deficit     Sensation: intact to LT     Reflex Exam:  DTR's:    Deep tendon reflexes in the upper and lower extremities are symmetrical bilaterally.   Toes:    The toes are equivocal bilaterally.   Clonus:  Clonus is absent.       Assessment/Plan:  23 y.o. male here as requested by Dr. Shelly Coss for seizures.   Past medical history uncontrolled medical problems due to non-compliance, diabetes, hyperlipidemia, hypertension, developmentally delayed, noncompliance with medical management, had a seizure as a child. Had recent partial onset secondarily generalized seizures in the setting of infection.   -Patient was started on Depakote inpatient however he has been noncompliant with this as with all his medications.  Patient says that Depakote is giving him right sided headaches however his symptoms more likely due to his ongoing dental abscess/infection.  Discussed this with patient and mother.  I do think Depakote would be a good medication given his mental retardation/developmental delay and mood problems so I will change it to extended release.  Encouraged patient to take his medications and discussed sequelae of untreated epilepsy.  Discussed Patients with epilepsy have a small risk of sudden unexpected death, a condition referred to as sudden unexpected death in epilepsy (SUDEP). SUDEP is defined specifically as the sudden, unexpected, witnessed or unwitnessed, nontraumatic and nondrowning death in patients with epilepsy with or without evidence for a seizure, and excluding documented status epilepticus, in which post mortem examination does not reveal a structural or toxicologic cause for death   Needs primary care, referred to Dr. Harrington Challenger at [facility] but called their office and not taking new patients or new medicaid patients.  Advised him that he should call Medicaid.  They do not like their current primary care physician.  Discussed seizure precautions, compliance with medications is very important.  Patient reports continued pain as well as new right-sided headaches this is likely due to his dental infection however will order a CT of the head with and without contrast to ensure there is no other etiologies such as spread of infection or brain abscess which is highly unlikely but can't rule it out and  mother is quite concerned and would like another CT  Needs to follow with his orthodontist for infection  Orders Placed This Encounter  Procedures  . CT HEAD W & WO CONTRAST  . Comprehensive metabolic panel  . CBC  . Valproic acid level     Sarina Ill, MD  [facility] [address] [place], [place] [address]  Phone [phone] Fax [phone]

## 2018-04-02 LAB — COMPREHENSIVE METABOLIC PANEL
ALBUMIN: 4.6 g/dL (ref 3.5–5.5)
ALT: 10 IU/L (ref 0–44)
AST: 7 IU/L (ref 0–40)
Albumin/Globulin Ratio: 1.4 (ref 1.2–2.2)
Alkaline Phosphatase: 105 IU/L (ref 39–117)
BUN/Creatinine Ratio: 14 (ref 9–20)
BUN: 13 mg/dL (ref 6–20)
Bilirubin Total: 0.9 mg/dL (ref 0.0–1.2)
CO2: 17 mmol/L — ABNORMAL LOW (ref 20–29)
Calcium: 10.2 mg/dL (ref 8.7–10.2)
Chloride: 89 mmol/L — ABNORMAL LOW (ref 96–106)
Creatinine, Ser: 0.96 mg/dL (ref 0.76–1.27)
GFR calc Af Amer: 129 mL/min/{1.73_m2} (ref >59)
GFR calc non Af Amer: 112 mL/min/{1.73_m2} (ref >59)
GLOBULIN, TOTAL: 3.2 g/dL (ref 1.5–4.5)
GLUCOSE: 381 mg/dL — AB (ref 65–99)
Potassium: 5.1 mmol/L (ref 3.5–5.2)
SODIUM: 127 mmol/L — AB (ref 134–144)
Total Protein: 7.8 g/dL (ref 6.0–8.5)

## 2018-04-02 LAB — VALPROIC ACID LEVEL: VALPROIC ACID LVL: 30 ug/mL — AB (ref 50–100)

## 2018-04-02 LAB — CBC
Hematocrit: 48 % (ref 37.5–51.0)
Hemoglobin: 15.9 g/dL (ref 13.0–17.7)
MCH: 28.4 pg (ref 26.6–33.0)
MCHC: 33.1 g/dL (ref 31.5–35.7)
MCV: 86 fL (ref 79–97)
Platelets: 188 10*3/uL (ref 150–450)
RBC: 5.59 x10E6/uL (ref 4.14–5.80)
RDW: 13.7 % (ref 11.6–15.4)
WBC: 15.5 10*3/uL — AB (ref 3.4–10.8)

## 2018-04-06 ENCOUNTER — Telehealth: Payer: Self-pay | Admitting: *Deleted

## 2018-04-06 NOTE — Telephone Encounter (Signed)
-----   Message from Anson Fret, MD sent at 04/05/2018 12:04 PM EST ----- Significantly elevated glucose, patient is non compliant with medications. Also other electrolyte abnormalities because of noncompliance, this is chronic. Depakote level is low. Again, recommend compliance.

## 2018-04-06 NOTE — Telephone Encounter (Signed)
Spoke to his mother, Vitaly Jasmer (on [place]).  She is aware of her son's lab results and verbalized understanding.  States it is difficult getting him to take his medications as prescribed.  She is going to speak with him concerning the importance of medication compliance.

## 2018-04-08 ENCOUNTER — Ambulatory Visit
Admission: RE | Admit: 2018-04-08 | Discharge: 2018-04-08 | Disposition: A | Discharge status: Home / Self Care | Payer: Medicaid Other | Source: Ambulatory Visit | Attending: Neurology | Admitting: Neurology

## 2018-04-08 DIAGNOSIS — G060 Intracranial abscess and granuloma: Secondary | ICD-10-CM

## 2018-04-08 DIAGNOSIS — G40001 Localization-related (focal) (partial) idiopathic epilepsy and epileptic syndromes with seizures of localized onset, not intractable, with status epilepticus: Secondary | ICD-10-CM

## 2018-04-08 MED ORDER — IOPAMIDOL (ISOVUE-300) INJECTION 61%
75.0000 mL | Freq: Once | INTRAVENOUS | Status: AC | PRN
  Administered 2018-04-08: 75 mL via INTRAVENOUS

## 2018-04-09 ENCOUNTER — Encounter (HOSPITAL_COMMUNITY): Payer: Self-pay | Admitting: Emergency Medicine

## 2018-04-09 ENCOUNTER — Telehealth: Payer: Self-pay | Admitting: Neurology

## 2018-04-09 ENCOUNTER — Inpatient Hospital Stay (HOSPITAL_COMMUNITY)
Admission: EM | Admit: 2018-04-09 | Discharge: 2018-04-14 | DRG: 024 | Disposition: A | Discharge status: Home Health | Payer: Medicaid Other | Attending: Internal Medicine | Admitting: Internal Medicine

## 2018-04-09 ENCOUNTER — Other Ambulatory Visit: Payer: Self-pay

## 2018-04-09 DIAGNOSIS — E119 Type 2 diabetes mellitus without complications: Secondary | ICD-10-CM

## 2018-04-09 DIAGNOSIS — E669 Obesity, unspecified: Secondary | ICD-10-CM | POA: Diagnosis present

## 2018-04-09 DIAGNOSIS — G4089 Other seizures: Secondary | ICD-10-CM | POA: Diagnosis present

## 2018-04-09 DIAGNOSIS — E785 Hyperlipidemia, unspecified: Secondary | ICD-10-CM | POA: Diagnosis present

## 2018-04-09 DIAGNOSIS — Z79899 Other long term (current) drug therapy: Secondary | ICD-10-CM

## 2018-04-09 DIAGNOSIS — M1990 Unspecified osteoarthritis, unspecified site: Secondary | ICD-10-CM | POA: Diagnosis present

## 2018-04-09 DIAGNOSIS — R6250 Unspecified lack of expected normal physiological development in childhood: Secondary | ICD-10-CM | POA: Diagnosis present

## 2018-04-09 DIAGNOSIS — Z9114 Patient's other noncompliance with medication regimen: Secondary | ICD-10-CM

## 2018-04-09 DIAGNOSIS — F17290 Nicotine dependence, other tobacco product, uncomplicated: Secondary | ICD-10-CM | POA: Diagnosis present

## 2018-04-09 DIAGNOSIS — Z23 Encounter for immunization: Secondary | ICD-10-CM

## 2018-04-09 DIAGNOSIS — E1165 Type 2 diabetes mellitus with hyperglycemia: Secondary | ICD-10-CM | POA: Diagnosis not present

## 2018-04-09 DIAGNOSIS — E871 Hypo-osmolality and hyponatremia: Secondary | ICD-10-CM | POA: Diagnosis not present

## 2018-04-09 DIAGNOSIS — I10 Essential (primary) hypertension: Secondary | ICD-10-CM | POA: Diagnosis present

## 2018-04-09 DIAGNOSIS — D72829 Elevated white blood cell count, unspecified: Secondary | ICD-10-CM

## 2018-04-09 DIAGNOSIS — Z6832 Body mass index (BMI) 32.0-32.9, adult: Secondary | ICD-10-CM

## 2018-04-09 DIAGNOSIS — F909 Attention-deficit hyperactivity disorder, unspecified type: Secondary | ICD-10-CM | POA: Diagnosis present

## 2018-04-09 DIAGNOSIS — G9389 Other specified disorders of brain: Secondary | ICD-10-CM

## 2018-04-09 DIAGNOSIS — K047 Periapical abscess without sinus: Secondary | ICD-10-CM | POA: Diagnosis present

## 2018-04-09 DIAGNOSIS — R350 Frequency of micturition: Secondary | ICD-10-CM | POA: Diagnosis present

## 2018-04-09 DIAGNOSIS — G060 Intracranial abscess and granuloma: Secondary | ICD-10-CM | POA: Diagnosis not present

## 2018-04-09 DIAGNOSIS — J45909 Unspecified asthma, uncomplicated: Secondary | ICD-10-CM | POA: Diagnosis present

## 2018-04-09 DIAGNOSIS — Z794 Long term (current) use of insulin: Secondary | ICD-10-CM

## 2018-04-09 DIAGNOSIS — R569 Unspecified convulsions: Secondary | ICD-10-CM

## 2018-04-09 DIAGNOSIS — E878 Other disorders of electrolyte and fluid balance, not elsewhere classified: Secondary | ICD-10-CM | POA: Diagnosis not present

## 2018-04-09 DIAGNOSIS — R739 Hyperglycemia, unspecified: Secondary | ICD-10-CM

## 2018-04-09 DIAGNOSIS — R197 Diarrhea, unspecified: Secondary | ICD-10-CM | POA: Diagnosis present

## 2018-04-09 DIAGNOSIS — E876 Hypokalemia: Secondary | ICD-10-CM | POA: Diagnosis not present

## 2018-04-09 DIAGNOSIS — E870 Hyperosmolality and hypernatremia: Secondary | ICD-10-CM

## 2018-04-09 LAB — COMPREHENSIVE METABOLIC PANEL
ALT: 10 U/L (ref 0–44)
AST: 11 U/L — ABNORMAL LOW (ref 15–41)
Albumin: 3.8 g/dL (ref 3.5–5.0)
Alkaline Phosphatase: 80 U/L (ref 38–126)
Anion gap: 13 (ref 5–15)
BUN: 9 mg/dL (ref 6–20)
CO2: 23 mmol/L (ref 22–32)
Calcium: 9.4 mg/dL (ref 8.9–10.3)
Chloride: 89 mmol/L — ABNORMAL LOW (ref 98–111)
Creatinine, Ser: 0.88 mg/dL (ref 0.61–1.24)
GFR calc Af Amer: >60 mL/min (ref >60)
GFR calc non Af Amer: >60 mL/min (ref >60)
Glucose, Bld: 512 mg/dL (ref 70–99)
Potassium: 4.1 mmol/L (ref 3.5–5.1)
Sodium: 125 mmol/L — ABNORMAL LOW (ref 135–145)
TOTAL PROTEIN: 7.1 g/dL (ref 6.5–8.1)
Total Bilirubin: 0.8 mg/dL (ref 0.3–1.2)

## 2018-04-09 LAB — CBG MONITORING, ED
Glucose-Capillary: 309 mg/dL — ABNORMAL HIGH (ref 70–99)
Glucose-Capillary: 402 mg/dL — ABNORMAL HIGH (ref 70–99)
Glucose-Capillary: 449 mg/dL — ABNORMAL HIGH (ref 70–99)

## 2018-04-09 LAB — I-STAT VENOUS BLOOD GAS, ED
ACID-BASE EXCESS: 4 mmol/L — AB (ref 0.0–2.0)
Bicarbonate: 29.4 mmol/L — ABNORMAL HIGH (ref 20.0–28.0)
O2 Saturation: 93 %
TCO2: 31 mmol/L (ref 22–32)
pCO2, Ven: 44 mmHg (ref 44.0–60.0)
pH, Ven: 7.434 — ABNORMAL HIGH (ref 7.250–7.430)
pO2, Ven: 67 mmHg — ABNORMAL HIGH (ref 32.0–45.0)

## 2018-04-09 LAB — CBC WITH DIFFERENTIAL/PLATELET
Abs Immature Granulocytes: 0.06 10*3/uL (ref 0.00–0.07)
Basophils Absolute: 0.1 10*3/uL (ref 0.0–0.1)
Basophils Relative: 0 %
EOS PCT: 0 %
Eosinophils Absolute: 0 10*3/uL (ref 0.0–0.5)
HCT: 45.8 % (ref 39.0–52.0)
Hemoglobin: 15 g/dL (ref 13.0–17.0)
Immature Granulocytes: 0 %
Lymphocytes Relative: 10 %
Lymphs Abs: 1.6 10*3/uL (ref 0.7–4.0)
MCH: 27.7 pg (ref 26.0–34.0)
MCHC: 32.8 g/dL (ref 30.0–36.0)
MCV: 84.5 fL (ref 80.0–100.0)
MONO ABS: 0.9 10*3/uL (ref 0.1–1.0)
Monocytes Relative: 5 %
Neutro Abs: 13.6 10*3/uL — ABNORMAL HIGH (ref 1.7–7.7)
Neutrophils Relative %: 85 %
Platelets: 364 10*3/uL (ref 150–400)
RBC: 5.42 MIL/uL (ref 4.22–5.81)
RDW: 13.4 % (ref 11.5–15.5)
WBC: 16.2 10*3/uL — ABNORMAL HIGH (ref 4.0–10.5)
nRBC: 0 % (ref 0.0–0.2)

## 2018-04-09 MED ORDER — VANCOMYCIN HCL 10 G IV SOLR
2500.0000 mg | Freq: Once | INTRAVENOUS | Status: AC
  Administered 2018-04-09: 2500 mg via INTRAVENOUS
  Filled 2018-04-09: qty 2500

## 2018-04-09 MED ORDER — INSULIN ASPART 100 UNIT/ML ~~LOC~~ SOLN
6.0000 [IU] | Freq: Once | SUBCUTANEOUS | Status: AC
  Administered 2018-04-09: 6 [IU] via INTRAVENOUS

## 2018-04-09 MED ORDER — INSULIN ASPART 100 UNIT/ML ~~LOC~~ SOLN: 2.0000 [IU] | SUBCUTANEOUS | Status: DC

## 2018-04-09 MED ORDER — METRONIDAZOLE IN NACL 5-0.79 MG/ML-% IV SOLN
500.0000 mg | Freq: Three times a day (TID) | INTRAVENOUS | Status: DC
  Administered 2018-04-09 – 2018-04-10 (×2): 500 mg via INTRAVENOUS
  Filled 2018-04-09 (×3): qty 100

## 2018-04-09 MED ORDER — SODIUM CHLORIDE 0.9 % IV SOLN
2.0000 g | Freq: Two times a day (BID) | INTRAVENOUS | Status: DC
  Administered 2018-04-09 – 2018-04-10 (×2): 2 g via INTRAVENOUS
  Filled 2018-04-09 (×4): qty 20

## 2018-04-09 MED ORDER — INSULIN ASPART 100 UNIT/ML ~~LOC~~ SOLN: 7.0000 [IU] | Freq: Once | SUBCUTANEOUS | Status: DC

## 2018-04-09 MED ORDER — LACTATED RINGERS IV SOLN
INTRAVENOUS | Status: DC
  Administered 2018-04-09: 17:00:00 via INTRAVENOUS

## 2018-04-09 MED ORDER — INSULIN ASPART 100 UNIT/ML ~~LOC~~ SOLN
0.0000 [IU] | SUBCUTANEOUS | Status: DC
  Administered 2018-04-09: 9 [IU] via SUBCUTANEOUS
  Administered 2018-04-10: 5 [IU] via SUBCUTANEOUS
  Administered 2018-04-10: 2 [IU] via SUBCUTANEOUS

## 2018-04-09 MED ORDER — INSULIN GLARGINE 100 UNIT/ML ~~LOC~~ SOLN
30.0000 [IU] | Freq: Once | SUBCUTANEOUS | Status: AC
  Administered 2018-04-09: 30 [IU] via SUBCUTANEOUS
  Filled 2018-04-09: qty 0.3

## 2018-04-09 MED ORDER — LACTATED RINGERS IV BOLUS
1000.0000 mL | Freq: Once | INTRAVENOUS | Status: AC
  Administered 2018-04-09: 1000 mL via INTRAVENOUS

## 2018-04-09 NOTE — Progress Notes (Addendum)
Anesthesia Eval note After reviewing the chart I discussed my concerns with Dr. Lovell Sheehan and ED provider. This patient has numerous untreated medical conditions and physiologic issues that put him at extremely high perioperative risk of morbidity and mortality. I stated that these issues should be addressed prior to coming to the OR, but if his condition is acutely life threatening and a true surgical emergency, I would obviously not hinder Dr. Lovell Sheehan from treating this patient and would do my best to keep him safe. Given that his CT scan was done 30 hours ago, and his clinic visit for his symptoms was 04/01/2018, I believe his risk from his medical conditions outweigh his risk from waiting ~12 hours in order to normalize blood sugar, electrolytes and intravascular volume status.

## 2018-04-09 NOTE — ED Notes (Signed)
Per MD Lovell Sheehan, surgery postponed until tomorrow, restart IV abx and pt can eat/drink until 0000.

## 2018-04-09 NOTE — Anesthesia Preprocedure Evaluation (Addendum)
Anesthesia Evaluation  Patient identified by MRN, date of birth, ID band Patient awake    Reviewed: Allergy & Precautions, H&P , NPO status , Patient's Chart, lab work & pertinent test results  Airway Mallampati: II  TM Distance: >3 FB Neck ROM: Full    Dental  (+) Missing,    Pulmonary asthma ,    breath sounds clear to auscultation       Cardiovascular hypertension,  Rhythm:Regular Rate:Normal     Neuro/Psych Seizures -,  PSYCHIATRIC DISORDERS    GI/Hepatic   Endo/Other  diabetes, Type 2  Renal/GU      Musculoskeletal  (+) Arthritis ,   Abdominal Normal abdominal exam  (+)   Peds  Hematology   Anesthesia Other Findings   Reproductive/Obstetrics                           Anesthesia Physical  Anesthesia Plan  ASA: III  Anesthesia Plan: General   Post-op Pain Management:    Induction: Intravenous  PONV Risk Score and Plan: 3 and Ondansetron, Dexamethasone and Treatment may vary due to age or medical condition  Airway Management Planned: Oral ETT  Additional Equipment: Arterial line  Intra-op Plan:   Post-operative Plan: Possible Post-op intubation/ventilation  Informed Consent: I have reviewed the patients History and Physical, chart, labs and discussed the procedure including the risks, benefits and alternatives for the proposed anesthesia with the patient or authorized representative who has indicated his/her understanding and acceptance.     Dental advisory given  Plan Discussed with: CRNA  Anesthesia Plan Comments: (See note by [place] for reasoning for delay)     Anesthesia Quick Evaluation

## 2018-04-09 NOTE — ED Notes (Signed)
Pt given diet coke. 

## 2018-04-09 NOTE — Consult Note (Signed)
NAME:  Mason Pratt, MRN:  [id number], DOB:  Feb 10, 1996, LOS: 0 ADMISSION DATE:  04/09/2018, CONSULTATION DATE:  04/09/2018 REFERRING MD:  Dr. Laverta Baltimore, CHIEF COMPLAINT:  Brain abscess  Brief History   5 yoM found to have right temporal brain abscess s/p dental extraction 12/10.  Devolvement of seizures on 12/26 with normal MRI brain, sent home on Depakote. Follow- up with Neurology and CT head showed new right temporal brain abscess.  NSGY consulted w/ plans for craniotomy however due to multiple metabolic derangements, surgery was deferred by anesthesia for stabilization of medical issues. PCCM called for possible ICU admission.  History of present illness   23 year old with history of poorly controlled DMT2, currently vapes, hypertension, hyperlipidemia, developmental delay (on disability) presenting to ER for abnormal head CT.  Underwent dental extraction 12/10.  Presented on 12/26 after witnessed seizure by mother and while in ER.  Seen by neurology.  Found to have normal MRI Development of seizures attributed hyperglycemia and amoxicillin for dental infection. Discharged on depakote and abx changed to clindamycin.  Followed up with Neurology with repeat CT head 1/16 which showed new right temporal brain abscess and therefore referred to ER.  Patient complains of facial for 2 months and after dental surgery states pain went up in his head.  No reports of fever or chills or vomiting.  Some reports of nausea, diarrhea, and urinary frequency.  Mother states patient has been compliant with home meds, last took insulin this morning but has not been able to check blood sugars because they do not have glucometer. No further reports of seizures since discharge in December.  In ER, Neurosurgery was consulted with plans to take for craniotomy for drainage of brain abscess, however anesthesia was concerned to place patient under general anesthesia due to metabolic derangements and recommends medical  stabilization of these prior to surgery.  Labs noted for Na 125 (corrected Na 132), K 4.1, glucose 512, AG 13, bicarb 23, WBC 16.2.  He has been afebrile, normo to minimally hypertensive and neurology intact.  PCCM called for possible ICU admit for stabilization of medical issues.   Past Medical History  Vapes, DMT2, hypertension, hyperlipidemia, developmental delay (on disability)  Dental extraction 12/10, development of seizures 12/26  Significant Hospital Events   1/17 Admit  Consults:   Procedures:   Significant Diagnostic Tests:  03/18/2018 MR Brain >> 1. Normal MRI appearance of the brain. 2. Confluent edema and abnormal enhancement in the medial right masticator space with small areas of pterygoid intramuscular necrosis or possibly abscess (series 12, image 4). Perhaps this is related to the recent dental extraction. There is secondary appearing inflammation of the right maxillary sinus  04/08/2018 [facility] >> Abnormal CT scan of the brain with and without contrast showing circumscribed hypodensity in the right temporal lobe with faint enhancement likely compatible with subacute brain abscess.  Significant change compared with previous MRI dated 03/18/2018.  Micro Data:   Antimicrobials:  Started on ceftriaxone, flagyl, and vancomycin per NSGY  Interim history/subjective:    Objective   Blood pressure 118/64, pulse (!) 113, temperature 98.9 F (37.2 C), temperature source Oral, resp. rate 20, height '6\' 8"'$  (2.032 m), weight 132.5 kg, SpO2 98 %.       No intake or output data in the 24 hours ending 04/09/18 2242 Filed Weights   04/09/18 1723  Weight: 132.5 kg    Examination: General:  Well nourished adult AA male lying in bed in NAD HEENT: MM  pink/moist, poor dentition  Neuro: Awake, answers questions approprietly, MAE CV: rrr, no m/r/g PULM: even/non-labored, lungs bilaterally clear EX:BMWU, non-tender, bs active  Extremities: warm/dry, no edema  Skin: no  rashes  Resolved Hospital Problem list    Assessment & Plan:  Right temporal brain abscess Hyperglycemia due to poorly controlled DM type 2 - not in DKA Mild hyponatremia- corrected Na 132 Hx seizures P:  Discussed with attending, Dr. Loyce Dys.  At this time, we do not feel that patient warrants admission to ICU for medical metabolic derangements.  Given patient is neurology intact, hemodynamically stable, and with no seizures since December, he can be safely admitted to [facility] service to progressive care.  Plan of care discussed with patient and patient's mother at bedside.   PCCM will sign off.  Please do not hesitate to call us back if we can be of any further assistance.   Labs   CBC: Recent Labs  Lab 04/09/18 1715  WBC 16.2*  NEUTROABS 13.6*  HGB 15.0  HCT 45.8  MCV 84.5  PLT 132    Basic Metabolic Panel: Recent Labs  Lab 04/09/18 1715  NA 125*  K 4.1  CL 89*  CO2 23  GLUCOSE 512*  BUN 9  CREATININE 0.88  CALCIUM 9.4   GFR: Estimated Creatinine Clearance: 206 mL/min (by C-G formula based on SCr of 0.88 mg/dL). Recent Labs  Lab 04/09/18 1715  WBC 16.2*    Liver Function Tests: Recent Labs  Lab 04/09/18 1715  AST 11*  ALT 10  ALKPHOS 80  BILITOT 0.8  PROT 7.1  ALBUMIN 3.8   No results for input(s): LIPASE, AMYLASE in the last 168 hours. No results for input(s): AMMONIA in the last 168 hours.  ABG    Component Value Date/Time   HCO3 29.4 (H) 04/09/2018 1856   TCO2 31 04/09/2018 1856   ACIDBASEDEF 5.2 (H) 04/13/2017 1016   O2SAT 93.0 04/09/2018 1856     Coagulation Profile: No results for input(s): INR, PROTIME in the last 168 hours.  Cardiac Enzymes: No results for input(s): CKTOTAL, CKMB, CKMBINDEX, TROPONINI in the last 168 hours.  HbA1C: Hgb A1c MFr Bld  Date/Time Value Ref Range Status  03/18/2018 04:46 PM 14.1 (H) 4.8 - 5.6 % Final    Comment:    (NOTE) Pre diabetes:          5.7%-6.4% Diabetes:              >6.4% Glycemic control  for   <7.0% adults with diabetes     CBG: Recent Labs  Lab 04/09/18 2009 04/09/18 2110 04/09/18 2222  GLUCAP 309* 402* 449*    Review of Systems:   POSITIVES IN BOLD Gen: Denies fever, chills, weight change, fatigue HEENT: Denies vision change, right facial pain  PULM: Denies shortness of breath, cough, sputum production, wheezing CV: Denies chest pain, edema, orthopnea,  GI: Denies abdominal pain, nausea, vomiting, diarrhea, hematochezia, melena, constipation, change in bowel habits GU: Denies dysuria, hematuria, polyuria, oliguria, urethral discharge Endocrine: Denies hot or cold intolerance, polyphagia or appetite change Derm: Denies rash, dry skin, skin change Heme: Denies easy bruising, bleeding Neuro: Denies headache- right temporal, numbness, weakness, slurred speech, loss of memory or consciousness  Past Medical History  He,  has a past medical history of ADD (attention deficit disorder), ADHD (attention deficit hyperactivity disorder), Arthritis, Cheek mass, Childhood asthma, Hyperlipidemia, Hypertension, Mental disorder, Seizure ([place]) (1997; 03/18/2018), and Type II diabetes mellitus ([place]).   Surgical History  Past Surgical History:  Procedure Laterality Date  . TOOTH EXTRACTION Bilateral 03/02/2018   Procedure: DENTAL RESTORATION/EXTRACTIONS;  Surgeon: Diona Browner, DDS;  Location: [facility];  Service: Oral Surgery;  Laterality: Bilateral;     Social History   reports that he has never smoked. He has never used smokeless tobacco. He reports that he does not drink alcohol or use drugs.   Family History   His family history includes Alzheimer's disease in an other family member; Fibromyalgia in an other family member; Headache in his brother and mother; Seizures in his brother.   Allergies No Known Allergies   Home Medications  Prior to Admission medications   Medication Sig Start Date End Date Taking? Authorizing Provider  divalproex (DEPAKOTE ER) 500 MG 24  hr tablet Take 2 tablets (1,000 mg total) by mouth daily. 04/01/18  Yes Melvenia Beam, MD  gabapentin (NEURONTIN) 300 MG capsule Take 1 capsule (300 mg total) by mouth 2 (two) times daily. 04/01/18  Yes Melvenia Beam, MD  ibuprofen (ADVIL,MOTRIN) 800 MG tablet Take 1 tablet (800 mg total) by mouth 3 (three) times daily. Patient taking differently: Take 800 mg by mouth daily as needed for moderate pain.  03/16/18  Yes Khatri, Hina, PA-C  insulin aspart (NOVOLOG) 100 UNIT/ML injection Substitute to any brand approved.Before each meal 3 times a day, 140-199 - 2 units, 200-250 - 4 units, 251-299 - 6 units,  300-349 - 8 units,  350 or above 10 units. Dispense syringes and needles as needed, Ok to switch to PEN if approved. DX DM2, Code E11.65 Patient taking differently: Inject 2-10 Units into the skin See admin instructions. Sliding Scale: 140-199 - 2 units, 200-250 - 4 units, 251-299 - 6 units,  300-349 - 8 units,  350 or above 10 units. 03/19/18  Yes Thurnell Lose, MD  insulin glargine (LANTUS) 100 UNIT/ML injection Inject 0.3 mLs (30 Units total) into the skin at bedtime. Dispense insulin pen if approved, if not dispense as needed syringes and needles for 1 month supply. Can switch to Levemir. Diagnosis E 11.65. Patient taking differently: Inject 35 Units into the skin at bedtime.  03/19/18  Yes Thurnell Lose, MD  metFORMIN (GLUCOPHAGE) 500 MG tablet Take 1 tablet (500 mg total) by mouth 2 (two) times daily with a meal. 03/19/18  Yes Thurnell Lose, MD  blood glucose meter kit and supplies KIT Dispense based on patient and insurance preference. Use up to four times daily as directed. (FOR ICD-9 250.00, 250.01). For QAC - HS accuchecks. 03/19/18   Thurnell Lose, MD  Insulin Syringe-Needle U-100 25G X 1" 1 ML MISC For 4 times a day insulin SQ, 1 month supply. Diagnosis E11.65 03/19/18   Thurnell Lose, MD  oxyCODONE-acetaminophen (PERCOCET) 5-325 MG tablet Take 1 tablet by mouth every 4  (four) hours as needed. Patient not taking: Reported on 03/18/2018 03/02/18   Diona Browner, DDS        Kennieth Rad, MSN, AGACNP-BC [place] Pgr: [phone] or if no answer [phone] 04/09/2018, 11:31 PM

## 2018-04-09 NOTE — Telephone Encounter (Signed)
Spoke to mother, patient has a brain abscess. She is taking him to [place]. I spoke with triage at the ED and they are expecting him.  Bethany-fyi

## 2018-04-09 NOTE — Telephone Encounter (Signed)
CT of the head showed a brain abscess. Have called number twice and left messages. Left my personal cell for them to call. I will keep trying. Patient needs to be admitted for treated.

## 2018-04-09 NOTE — ED Provider Notes (Signed)
[facility] EMERGENCY DEPARTMENT Provider Note   CSN: [id number] Arrival date & time: 04/09/18  1539   History   Chief Complaint No chief complaint on file.   HPI Mason Pratt is a 23 y.o. male.  HPI   Mason Pratt is a 23 y.o. 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  The patient initially started having pain in his right posterior maxillary space and right buccal mucosal area in early November.  Mom reports this January through November and then followed with oral surgery who performed wisdom tooth extraction on February 27, 2018.  He had persistent pain in that area and a "lump" but did not appear to respond well to anti-inflammatories and antibiotics prescribed by prior providers.  He has undergone recent CT and MRI in December that did not show evidence for the brain abscess that he was found to have today.  Mom reports that he was last on antibiotics in late December.  He has had persistent right-sided headache that is nearly every day and sharp and throbbing.  No vision changes.  He has had multiple seizures in December and was started on Depakote after being evaluated by neurology.  Mom reports compliance.  Reports that he still has some pain in his right cheek where he previously had discomfort although it is improved.  Denies fever and chills and is eating and drinking normally.  No numbness, weakness, tingling or paresthesias.  Past Medical History:  Diagnosis Date  . ADD (attention deficit disorder)   . ADHD (attention deficit hyperactivity disorder)   . Arthritis    "knees" (03/18/2018"  . Cheek mass    "noted when he had wisdom teeth taken out 03/02/2018" (03/18/2018)  . Childhood asthma   . Hyperlipidemia   . Hypertension   . Mental disorder    "EMD:  emotional mental disorder;  dx'd at age 29 months" (03/18/2018)  . Seizure [facility]) 1997; 03/18/2018   "was told that indicated that he would be a special needs child;"  . Type II diabetes mellitus [facility])     Patient Active Problem List   Diagnosis Date Noted  . Brain abscess 04/10/2018  . pseudohyponatremia 04/10/2018  . Brain mass 04/10/2018  . Seizures ([place]) 03/18/2018  . DM (diabetes mellitus), type 2 ([place]) 03/18/2018  . Hyponatremia 03/18/2018  . Dental abscess 03/18/2018  . Essential hypertension 03/18/2018    Past Surgical History:  Procedure Laterality Date  . TOOTH EXTRACTION Bilateral 03/02/2018   Procedure: DENTAL RESTORATION/EXTRACTIONS;  Surgeon: Diona Browner, DDS;  Location: [place];  Service: Oral Surgery;  Laterality: Bilateral;        Home Medications    Prior to Admission medications   Medication Sig Start Date End Date Taking? Authorizing Provider  divalproex (DEPAKOTE ER) 500 MG 24 hr tablet Take 2 tablets (1,000 mg total) by mouth daily. 04/01/18  Yes Melvenia Beam, MD  gabapentin (NEURONTIN) 300 MG capsule Take 1 capsule (300 mg total) by mouth 2 (two) times daily. 04/01/18  Yes Melvenia Beam, MD  ibuprofen (ADVIL,MOTRIN) 800 MG tablet Take 1 tablet (800 mg total) by mouth 3 (three) times daily. Patient taking differently: Take 800 mg by mouth daily as needed for moderate pain.  03/16/18  Yes Khatri, Hina, PA-C  insulin aspart (NOVOLOG) 100 UNIT/ML injection Substitute to  any brand approved.Before each meal 3 times a day, 140-199 - 2 units, 200-250 - 4 units, 251-299 - 6 units,  300-349 - 8 units,  350 or above 10 units. Dispense syringes and needles as needed, Ok to switch to PEN if approved. DX DM2, Code E11.65 Patient taking differently: Inject 2-10 Units into the skin See admin instructions. Sliding Scale: 140-199 - 2 units, 200-250 - 4 units, 251-299 - 6 units,  300-349 - 8 units,  350 or above 10 units. 03/19/18  Yes Thurnell Lose, MD  insulin glargine (LANTUS) 100 UNIT/ML  injection Inject 0.3 mLs (30 Units total) into the skin at bedtime. Dispense insulin pen if approved, if not dispense as needed syringes and needles for 1 month supply. Can switch to Levemir. Diagnosis E 11.65. Patient taking differently: Inject 35 Units into the skin at bedtime.  03/19/18  Yes Thurnell Lose, MD  metFORMIN (GLUCOPHAGE) 500 MG tablet Take 1 tablet (500 mg total) by mouth 2 (two) times daily with a meal. 03/19/18  Yes Thurnell Lose, MD  blood glucose meter kit and supplies KIT Dispense based on patient and insurance preference. Use up to four times daily as directed. (FOR ICD-9 250.00, 250.01). For QAC - HS accuchecks. 03/19/18   Thurnell Lose, MD  Insulin Syringe-Needle U-100 25G X 1" 1 ML MISC For 4 times a day insulin SQ, 1 month supply. Diagnosis E11.65 03/19/18   Thurnell Lose, MD  oxyCODONE-acetaminophen (PERCOCET) 5-325 MG tablet Take 1 tablet by mouth every 4 (four) hours as needed. Patient not taking: Reported on 03/18/2018 03/02/18   Diona Browner, DDS    Family History Family History  Problem Relation Age of Onset  . Headache Mother   . Seizures Brother   . Headache Brother   . Alzheimer's disease Other        mat great grandmother, maternal great uncle, great aunts on mother's side   . Fibromyalgia Other        mother's side of the family     Social History Social History   Tobacco Use  . Smoking status: Never Smoker  . Smokeless tobacco: Never Used  Substance Use Topics  . Alcohol use: No  . Drug use: Never     Allergies   Patient has no known allergies.   Review of Systems Review of Systems  Constitutional: Negative for chills and fever.  HENT: Positive for dental problem. Negative for ear pain, facial swelling and sore throat.   Eyes: Negative for pain and visual disturbance.  Respiratory: Negative for cough and shortness of breath.   Cardiovascular: Negative for chest pain and palpitations.  Gastrointestinal: Negative for  abdominal pain and vomiting.  Genitourinary: Negative for dysuria and hematuria.  Musculoskeletal: Negative for arthralgias and back pain.  Skin: Negative for color change and rash.  Neurological: Positive for seizures and headaches. Negative for syncope.  All other systems reviewed and are negative.    Physical Exam Updated Vital Signs BP 118/64   Pulse (!) 113   Temp 98.9 F (37.2 C) (Oral)   Resp 20   Ht 6' 8" (2.032 m)   Wt 132.5 kg   SpO2 98%   BMI 32.08 kg/m   Physical Exam Vitals signs and nursing note reviewed.  Constitutional:      Appearance: Normal appearance. He is well-developed. He is not ill-appearing.  HENT:     Head: Normocephalic and atraumatic.     Jaw: There is normal jaw occlusion.  Comments: Status post with some tooth extraction x4.  Wounds are well-healed.  There is no erythema, induration or crepitus.  No fluctuance on exam.  No evidence for buccal or lingual cellulitis.  Submandibular space is soft and without palpable mass.  Tolerating secretions without difficulty.  Posterior oropharynx is normal.  No lymphadenopathy.  Mild tenderness to palpation about the midline buccal mucosa on the right with no palpable or visible pathology.    Mouth/Throat:     Lips: Pink.     Mouth: Mucous membranes are moist. No oral lesions or angioedema.     Dentition: Abnormal dentition (Sclerae and central incisor missing bilaterally.). No dental tenderness, gingival swelling, dental abscesses or gum lesions.     Tongue: No lesions. Tongue protrudes in midline.     Pharynx: Oropharynx is clear.     Tonsils: No tonsillar exudate or tonsillar abscesses.  Eyes:     Conjunctiva/sclera: Conjunctivae normal.  Neck:     Musculoskeletal: Neck supple.  Cardiovascular:     Rate and Rhythm: Normal rate and regular rhythm.     Heart sounds: No murmur.  Pulmonary:     Effort: Pulmonary effort is normal. No respiratory distress.     Breath sounds: Normal breath sounds.    Abdominal:     Palpations: Abdomen is soft.     Tenderness: There is no abdominal tenderness.  Skin:    General: Skin is warm and dry.  Neurological:     Mental Status: He is alert.  Psychiatric:        Behavior: Behavior is cooperative.      ED Treatments / Results  Labs (all labs ordered are listed, but only abnormal results are displayed) Labs Reviewed  CBC WITH DIFFERENTIAL/PLATELET - Abnormal; Notable for the following components:      Result Value   WBC 16.2 (*)    Neutro Abs 13.6 (*)    All other components within normal limits  COMPREHENSIVE METABOLIC PANEL - Abnormal; Notable for the following components:   Sodium 125 (*)    Chloride 89 (*)    Glucose, Bld 512 (*)    AST 11 (*)    All other components within normal limits  I-STAT VENOUS BLOOD GAS, ED - Abnormal; Notable for the following components:   pH, Ven 7.434 (*)    pO2, Ven 67.0 (*)    Bicarbonate 29.4 (*)    Acid-Base Excess 4.0 (*)    All other components within normal limits  CBG MONITORING, ED - Abnormal; Notable for the following components:   Glucose-Capillary 309 (*)    All other components within normal limits  CBG MONITORING, ED - Abnormal; Notable for the following components:   Glucose-Capillary 402 (*)    All other components within normal limits  CBG MONITORING, ED - Abnormal; Notable for the following components:   Glucose-Capillary 449 (*)    All other components within normal limits  CULTURE, BLOOD (ROUTINE X 2)  CULTURE, BLOOD (ROUTINE X 2)  BLOOD GAS, VENOUS  CBG MONITORING, ED    EKG None  Radiology Ct Head W & Wo Contrast  Result Date: 04/09/2018  [facility] [address], [place], [place] [address] [id number] NEUROIMAGING REPORT STUDY DATE: 04/08/18 PATIENT NAME: SRIHAAN MASTRANGELO DOB: 01-23-1996 MRN: [id number] ORDERING CLINICIAN: Dr Jaynee Eagles CLINICAL HISTORY: 22 year patient with headache COMPARISON FILMS: MRI Brain w/wo 03/18/2018 EXAM: CT Head w/wo  TECHNIQUE: CT scan of the head was obtained utilizing 5 mm axial  slices from the skull base to the vertex. CONTRAST:  75 ml iv omnipaque IMAGING SITE: [facility] FINDINGS: Deep brain parenchyma shows a well-circumscribed area of hypodensity in the right anterior temporal lobe which measures 2.2 mm in the AP, 2.2 in the transverse and 1.8 mm in the vertical dimension.  There is faint enhancement after contrast particularly in the posterior margin.  There is mild surrounding cytotoxic edema, but no significant compression of the temporal horn or hydrocephalus.  Rest of the brain parenchyma appears unremarkable.  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  No other structural lesion tumor or infarcts are noted.  The ventricular system appear unremarkable.  Calvarium shows no abnormalities.  There does not appear to be significant bony involvement of the temporal  bone.   Abnormal CT scan of the brain with and without contrast showing circumscribed hypodensity in the right temporal lobe with faint enhancement likely compatible with subacute brain abscess.  Significant change compared with previous MRI dated 03/18/2018. INTERPRETING PHYSICIAN: PRAMOD SETHI, MD Certified in  Neuroimaging by [employer] and [employer]    Procedures Procedures (including critical care time)  Medications Ordered in ED Medications  cefTRIAXone (ROCEPHIN) 2 g in sodium chloride 0.9 % 100 mL IVPB (0 g Intravenous Stopped 04/09/18 2238)  metroNIDAZOLE (FLAGYL) IVPB 500 mg (0 mg Intravenous Stopped 04/09/18 1757)  lactated ringers infusion ( Intravenous New Bag/Given 04/09/18 1720)  insulin aspart (novoLOG) injection 0-9 Units (9 Units Subcutaneous Given 04/09/18 2323)  vancomycin (VANCOCIN) 2,500 mg in sodium chloride 0.9 % 500 mL IVPB (0 mg Intravenous Stopped 04/09/18 2149)  lactated ringers bolus 1,000 mL (0  mLs Intravenous Stopped 04/09/18 2149)  insulin aspart (novoLOG) injection 6 Units (6 Units Intravenous Given 04/09/18 1909)  insulin aspart (novoLOG) injection 6 Units (6 Units Intravenous Given 04/09/18 2206)  insulin glargine (LANTUS) injection 30 Units (30 Units Subcutaneous Given 04/09/18 2323)     Initial Impression / Assessment and Plan / ED Course  I have reviewed the triage vital signs and the nursing notes.  Pertinent labs & imaging results that were available during my care of the patient were reviewed by me and considered in my medical decision making (see chart for details).      MDM:  Imaging: CT head from earlier today at outside facility showing circumscribed hypodensity in the right temporal lobe with faint enhancement likely compatible with subacute brain abscess.  ED Provider Interpretation of EKG: None indicated at this time  Labs: CMP with sodium 125, chloride 89, K4.1, CO2 23, glucose 512, creatinine 0.8, gap 13, CBC with white count of 16 and 85% PMNs, venous blood gas 7.4/44/67/29  On initial evaluation, patient appears well. Afebrile and hemodynamically stable. Alert and oriented x4, pleasant, and cooperative.  Presents with mother at the bedside with concern for brain abscess and outside imaging as detailed above.  On exam, patient has no focal deficits.  He is at his neurologic baseline.  There is no evidence for significant intraoral pathology at this time including RPA or PTA.  No evidence for tonsillitis or tonsillar abscess.  No evidence for buccolingual cellulitis.  No significant dental pathology noted.  Wounds are healed well from prior surgery.  Submandibular space is normal and no evidence for Ludwig's angina.  Remainder of head and neck exam is unremarkable.  Patient has no neck pain or decreased range of motion of his neck.  No  fever.  Given the CT findings blood cultures were collected and patient was started on vancomycin, Rocephin, and Flagyl IV.   Neurosurgery consulted and reviewed imaging in the ED at the bedside.  They plan for surgical intervention this evening and recommended stopping antibiotics.  These were halted at that time after discussion with the nurse at the bedside.    When neurosurgery evaluated the patient the bedside I spoke to anesthesia regarding plan to start the case this evening.  Anesthesia at that time was concerned that the patient may be in DKA or otherwise too ill to go to the operating room at this time.  The patient does not meet criteria for DKA with a CO2 of 23, anion gap of 13, and appears well on exam.  Glucose is 512 and he does have hyponatremia at 125 that is more consistent with pseudohyponatremia.  Corrected around 132-135 with his current glucose.  Still mildly hyponatremic. Does not meet criteria for HHS.  Given IV LR bolus x1 L and started on IV LR maintenance fluids.  N.p.o. ordered with possible surgical intervention.  Neurosurgery then recommended patient to be admitted to the ICU for neuro monitoring as he has had seizures at home with a possible focus in the temporal lobe making him higher risk.  Discussed level of care with Dr. Arnoldo Morale further and he felt more comfortable with patient in the ICU.  Consult to critical care placed at that time.  Antibiotics restarted after plan to hold patient from the operating room at this time.  He was given 6 units of IV regular insulin x2 with transient decrease of his glucose into the 300s but then subsequent rebound in the mid 400s.  His home Lantus was ordered at 30 units and then sliding scale ordered according to what he has been using at home.  Consulted pharmacy about this plan who agreed.   Spoke to critical care team over the phone who did not feel that the patient required ICU level of care and planned to see him at the bedside.  Critical care team evaluated him in the ED and did not feel he required ICU level of care.  Agreed well for level of care.   Hospitalist admitted patient in stable condition thereafter.  The plan for this patient was discussed with Dr. Laverta Baltimore who voiced agreement and who oversaw evaluation and treatment of this patient.   The patient was fully informed and involved with the history taking, evaluation, workup including labs/images, and plan. The patient's concerns and questions were addressed to the patient's satisfaction and he expressed agreement with the plan to admit.    Final Clinical Impressions(s) / ED Diagnoses   Final diagnoses:  Brain abscess  Leukocytosis, unspecified type  Hyponatremia  Hyperglycemia  History of medication noncompliance    ED Discharge Orders    None       Rileigh Kawashima, Rodena Goldmann, MD 04/10/18 0013    Margette Fast, MD 04/10/18 [phone]

## 2018-04-09 NOTE — ED Notes (Signed)
Per MD Scheidler, pause all antibiotics per instruction from neurosurgery team.

## 2018-04-09 NOTE — Consult Note (Signed)
Reason for Consult: Right brain abscess Referring Physician: Dr. Cherre Blanc  JARMEL KMETZ is an 23 y.o. male.  HPI: The patient is a 23 year old black male with developmental delay who had multiple teeth extracted.  Patient had persistent pain and had a brain MRI about 3 weeks ago which demonstrated evidence of infection in the resection bed.  At that time there was no evidence of intracranial abnormality.  He also had partial seizures.  He was seen by neurology Dr. Daisy Blossom.  The patient complained of persistent headaches and facial pain.  She ordered a repeat head CT which was done yesterday.  It demonstrated findings consistent with a right temporal brain abscess.  The patient was called and directed to the ER.  I was called from the ER.  Presently the patient is accompanied by his mother.  He complains of right temporal headaches and right facial pain.  I discussed the situation with the patient and his mother and recommended a craniotomy for drainage of his brain abscess.  They agreed and I posted him for surgery.  He was subsequently found to have blood glucose in the 500s and hyponatremic.  The anesthesiologist, Dr.Germeroth, did not feel the patient was medically fit to be placed under general anesthesia and instead recommended a local care admission and resolution of his hyperglycemia and hyponatremia.  Past Medical History:  Diagnosis Date  . ADD (attention deficit disorder)   . ADHD (attention deficit hyperactivity disorder)   . Arthritis    "knees" (03/18/2018"  . Cheek mass    "noted when he had wisdom teeth taken out 03/02/2018" (03/18/2018)  . Childhood asthma   . Hyperlipidemia   . Hypertension   . Mental disorder    "EMD:  emotional mental disorder; dx'd at age 9 months" (03/18/2018)  . Seizure [facility]) 1997; 03/18/2018   "was told that indicated that he would be a special needs child;"  . Type II diabetes mellitus (HCC)     Past Surgical History:  Procedure Laterality  Date  . TOOTH EXTRACTION Bilateral 03/02/2018   Procedure: DENTAL RESTORATION/EXTRACTIONS;  Surgeon: Ocie Doyne, DDS;  Location: [facility] OR;  Service: Oral Surgery;  Laterality: Bilateral;    Family History  Problem Relation Age of Onset  . Headache Mother   . Seizures Brother   . Headache Brother   . Alzheimer's disease Other        mat great grandmother, maternal great uncle, great aunts on mother's side   . Fibromyalgia Other        mother's side of the family     Social History:  reports that he has never smoked. He has never used smokeless tobacco. He reports that he does not drink alcohol or use drugs.  Allergies: No Known Allergies  Medications:  I have reviewed the patient's current medications. Prior to Admission: (Not in a hospital admission)  Scheduled: . insulin aspart  6 Units Intravenous Once   Continuous: . cefTRIAXone (ROCEPHIN)  IV    . lactated ringers 1,000 mL (04/09/18 1851)  . lactated ringers 100 mL/hr at 04/09/18 1720  . metronidazole Stopped (04/09/18 1757)  . vancomycin Stopped (04/09/18 1758)   PRN: Anti-infectives (From admission, onward)   Start     Dose/Rate Route Frequency Ordered Stop   04/09/18 2200  cefTRIAXone (ROCEPHIN) 2 g in sodium chloride 0.9 % 100 mL IVPB     2 g 200 mL/hr over 30 Minutes Intravenous Every 12 hours 04/09/18 1647     04/09/18  1700  vancomycin (VANCOCIN) 2,500 mg in sodium chloride 0.9 % 500 mL IVPB     2,500 mg 250 mL/hr over 120 Minutes Intravenous  Once 04/09/18 1647     04/09/18 1700  metroNIDAZOLE (FLAGYL) IVPB 500 mg     500 mg 100 mL/hr over 60 Minutes Intravenous Every 8 hours 04/09/18 1647         Results for orders placed or performed during the hospital encounter of 04/09/18 (from the past 48 hour(s))  CBC with Differential     Status: Abnormal   Collection Time: 04/09/18  5:15 PM  Result Value Ref Range   WBC 16.2 (H) 4.0 - 10.5 K/uL   RBC 5.42 4.22 - 5.81 MIL/uL   Hemoglobin 15.0 13.0 - 17.0 g/dL    HCT 16.145.8 09.639.0 - 04.552.0 %   MCV 84.5 80.0 - 100.0 fL   MCH 27.7 26.0 - 34.0 pg   MCHC 32.8 30.0 - 36.0 g/dL   RDW 40.913.4 81.111.5 - 91.415.5 %   Platelets 364 150 - 400 K/uL   nRBC 0.0 0.0 - 0.2 %   Neutrophils Relative % 85 %   Neutro Abs 13.6 (H) 1.7 - 7.7 K/uL   Lymphocytes Relative 10 %   Lymphs Abs 1.6 0.7 - 4.0 K/uL   Monocytes Relative 5 %   Monocytes Absolute 0.9 0.1 - 1.0 K/uL   Eosinophils Relative 0 %   Eosinophils Absolute 0.0 0.0 - 0.5 K/uL   Basophils Relative 0 %   Basophils Absolute 0.1 0.0 - 0.1 K/uL   Immature Granulocytes 0 %   Abs Immature Granulocytes 0.06 0.00 - 0.07 K/uL    Comment: Performed at [facility], [address] [place], [place] [address]  Comprehensive metabolic panel     Status: Abnormal   Collection Time: 04/09/18  5:15 PM  Result Value Ref Range   Sodium 125 (L) 135 - 145 mmol/L   Potassium 4.1 3.5 - 5.1 mmol/L   Chloride 89 (L) 98 - 111 mmol/L   CO2 23 22 - 32 mmol/L   Glucose, Bld 512 (HH) 70 - 99 mg/dL    Comment: CRITICAL RESULT CALLED TO, READ BACK BY AND VERIFIED WITH: K CARTER,RN 1807 04/09/18 D BRADLEY    BUN 9 6 - 20 mg/dL   Creatinine, Ser 6.210.88 0.61 - 1.24 mg/dL   Calcium 9.4 8.9 - 30.810.3 mg/dL   Total Protein 7.1 6.5 - 8.1 g/dL   Albumin 3.8 3.5 - 5.0 g/dL   AST 11 (L) 15 - 41 U/L   ALT 10 0 - 44 U/L   Alkaline Phosphatase 80 38 - 126 U/L   Total Bilirubin 0.8 0.3 - 1.2 mg/dL   GFR calc non Af Amer >60 >60 mL/min   GFR calc Af Amer >60 >60 mL/min   Anion gap 13 5 - 15    Comment: Performed at [facility], [address] [place], [place] [address]    Ct Almyra DeforestHead W & Wo Contrast  Result Date: 04/09/2018  [facility] [address], [address] [place], [place] [address] [phone] NEUROIMAGING REPORT STUDY DATE: 04/08/18 PATIENT NAME: Florian BuffReece A Netzley DOB: February 14, 1996 MRN: [id number] ORDERING CLINICIAN: Dr Lucia GaskinsAhern CLINICAL HISTORY: 22 year patient with headache COMPARISON FILMS: MRI Brain w/wo 03/18/2018 EXAM: CT  Head w/wo TECHNIQUE: CT scan of the head was obtained utilizing 5 mm axial slices from the skull base to the vertex. CONTRAST:  75 ml iv omnipaque IMAGING SITE: [place]  Imaging FINDINGS: Deep brain parenchyma shows a well-circumscribed area of hypodensity in the right anterior temporal lobe which measures 2.2 mm in the AP, 2.2 in the transverse and 1.8 mm in the vertical dimension.  There is faint enhancement after contrast particularly in the posterior margin.  There is mild surrounding cytotoxic edema, but no significant compression of the temporal horn or hydrocephalus.  Rest of the brain parenchyma appears unremarkable.  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  No other structural lesion tumor or infarcts are noted.  The ventricular system appear unremarkable.  Calvarium shows no abnormalities.  There does not appear to be significant bony involvement of the temporal  bone.   Abnormal CT scan of the brain with and without contrast showing circumscribed hypodensity in the right temporal lobe with faint enhancement likely compatible with subacute brain abscess.  Significant change compared with previous MRI dated 03/18/2018. INTERPRETING PHYSICIAN: PRAMOD SETHI, MD Certified in  Neuroimaging by [employer] of Neuroimaging and [employer]rological Subspecialities    ROS Blood pressure (!) 151/91, pulse 93, temperature 98.9 F (37.2 C), temperature source Oral, resp. rate 20, height 6\' 8"  (2.032 m), weight 132.5 kg, SpO2 98 %. Estimated body mass index is 32.08 kg/m as calculated from the following:   Height as of this encounter: 6\' 8"  (2.032 m).   Weight as of this encounter: 132.5 kg.  Physical Exam  General: A somnolent but easily arousable 23 year old black male complaining of right headache  HEENT: Normocephalic, his pupils are equal round reactive light, extraocular muscles are intact, dentition poor  Neck:  Supple without mass or deformities.  There is no meningismus.  Thorax: Symmetric  Abdomen: Soft  Extremities: Unremarkable  Neurologic exam the patient is alert and oriented x2, person and most gone hospital.  He did not know the month but according to the patient's mother this is at his baseline.  His speech is normal.  His strength is grossly normal in his bilateral bicep, tricep, handgrip, gastrocnemius and dorsiflexors.  Cerebellar function is intact to rapid alternating movements of the upper extremities bilaterally.  Sensory function is intact to light touch sensation all tested dermatomes bilaterally.  Imaging studies: I have reviewed the patient's head CT performed 04/08/2018.  There is a right temporal hypodensity assistant with a brain abscess.  This was not present in his previous MRI of 03/18/2018.  Assessment/Plan: Right brain abscess: As above I have recommended surgery in order to venous abscess and obtain good cultures, preferably before starting antibiotics.  I explained the procedure to the patient and his mother.  We discussed the risks including the risk of anesthesia, hemorrhage, infection, recurrent abscess, seizures, medical risk, etc.  I have answered all their questions.  The patient's mother has consented on his behalf.  I posted the patient for surgery and the on-call team has arrived.  Dr. Renold Don, the anesthesiologist does not feel he is medically fit to be placed under general anesthesia.  Presently he is not in extremis.  We will therefore delay the surgery until he is medically optimized by the critical care team.  Perhaps we can do it tomorrow.  In the meantime we will start empiric antibiotics, i.e Flagyl, vancomycin and ceftriaxone.  Cristi Loron 04/09/2018, 6:59 PM

## 2018-04-09 NOTE — ED Notes (Signed)
Per surgery team, pt to stay in ED until CBG improves, this RN will continue to monitor and wait for further instructions.

## 2018-04-09 NOTE — ED Notes (Addendum)
Pt ambulatory to the bathroom with no reported issues.  

## 2018-04-09 NOTE — ED Triage Notes (Signed)
Pt here from home with abnormal result form ct scan of the head showing a possible abcess

## 2018-04-10 ENCOUNTER — Emergency Department (HOSPITAL_COMMUNITY): Payer: Medicaid Other | Admitting: Certified Registered Nurse Anesthetist

## 2018-04-10 ENCOUNTER — Encounter (HOSPITAL_COMMUNITY): Payer: Self-pay | Admitting: Certified Registered"

## 2018-04-10 ENCOUNTER — Encounter (HOSPITAL_COMMUNITY)
Admission: EM | Disposition: A | Discharge status: Home Health | Payer: Self-pay | Source: Home / Self Care | Attending: Internal Medicine

## 2018-04-10 DIAGNOSIS — F17290 Nicotine dependence, other tobacco product, uncomplicated: Secondary | ICD-10-CM | POA: Diagnosis present

## 2018-04-10 DIAGNOSIS — J45909 Unspecified asthma, uncomplicated: Secondary | ICD-10-CM | POA: Diagnosis present

## 2018-04-10 DIAGNOSIS — E871 Hypo-osmolality and hyponatremia: Secondary | ICD-10-CM | POA: Diagnosis present

## 2018-04-10 DIAGNOSIS — E878 Other disorders of electrolyte and fluid balance, not elsewhere classified: Secondary | ICD-10-CM | POA: Diagnosis not present

## 2018-04-10 DIAGNOSIS — R197 Diarrhea, unspecified: Secondary | ICD-10-CM | POA: Diagnosis present

## 2018-04-10 DIAGNOSIS — Z794 Long term (current) use of insulin: Secondary | ICD-10-CM | POA: Diagnosis not present

## 2018-04-10 DIAGNOSIS — E870 Hyperosmolality and hypernatremia: Secondary | ICD-10-CM

## 2018-04-10 DIAGNOSIS — G4089 Other seizures: Secondary | ICD-10-CM | POA: Diagnosis present

## 2018-04-10 DIAGNOSIS — E1165 Type 2 diabetes mellitus with hyperglycemia: Secondary | ICD-10-CM | POA: Diagnosis present

## 2018-04-10 DIAGNOSIS — E1169 Type 2 diabetes mellitus with other specified complication: Secondary | ICD-10-CM | POA: Diagnosis not present

## 2018-04-10 DIAGNOSIS — E119 Type 2 diabetes mellitus without complications: Secondary | ICD-10-CM | POA: Diagnosis not present

## 2018-04-10 DIAGNOSIS — M1990 Unspecified osteoarthritis, unspecified site: Secondary | ICD-10-CM | POA: Diagnosis present

## 2018-04-10 DIAGNOSIS — R350 Frequency of micturition: Secondary | ICD-10-CM | POA: Diagnosis present

## 2018-04-10 DIAGNOSIS — Z79899 Other long term (current) drug therapy: Secondary | ICD-10-CM | POA: Diagnosis not present

## 2018-04-10 DIAGNOSIS — F909 Attention-deficit hyperactivity disorder, unspecified type: Secondary | ICD-10-CM | POA: Diagnosis present

## 2018-04-10 DIAGNOSIS — G9389 Other specified disorders of brain: Secondary | ICD-10-CM

## 2018-04-10 DIAGNOSIS — Z6832 Body mass index (BMI) 32.0-32.9, adult: Secondary | ICD-10-CM | POA: Diagnosis not present

## 2018-04-10 DIAGNOSIS — G060 Intracranial abscess and granuloma: Secondary | ICD-10-CM | POA: Diagnosis present

## 2018-04-10 DIAGNOSIS — I10 Essential (primary) hypertension: Secondary | ICD-10-CM | POA: Diagnosis present

## 2018-04-10 DIAGNOSIS — Z23 Encounter for immunization: Secondary | ICD-10-CM | POA: Diagnosis not present

## 2018-04-10 DIAGNOSIS — K047 Periapical abscess without sinus: Secondary | ICD-10-CM | POA: Diagnosis present

## 2018-04-10 DIAGNOSIS — E669 Obesity, unspecified: Secondary | ICD-10-CM | POA: Diagnosis present

## 2018-04-10 DIAGNOSIS — R569 Unspecified convulsions: Secondary | ICD-10-CM | POA: Diagnosis not present

## 2018-04-10 DIAGNOSIS — E876 Hypokalemia: Secondary | ICD-10-CM | POA: Diagnosis not present

## 2018-04-10 DIAGNOSIS — E785 Hyperlipidemia, unspecified: Secondary | ICD-10-CM | POA: Diagnosis present

## 2018-04-10 DIAGNOSIS — E118 Type 2 diabetes mellitus with unspecified complications: Secondary | ICD-10-CM | POA: Diagnosis not present

## 2018-04-10 DIAGNOSIS — Z98818 Other dental procedure status: Secondary | ICD-10-CM | POA: Diagnosis not present

## 2018-04-10 DIAGNOSIS — Z9114 Patient's other noncompliance with medication regimen: Secondary | ICD-10-CM | POA: Diagnosis not present

## 2018-04-10 DIAGNOSIS — R6250 Unspecified lack of expected normal physiological development in childhood: Secondary | ICD-10-CM | POA: Diagnosis present

## 2018-04-10 HISTORY — PX: CRANIOTOMY: SHX93

## 2018-04-10 LAB — HEPATIC FUNCTION PANEL
ALBUMIN: 3.3 g/dL — AB (ref 3.5–5.0)
ALT: 10 U/L (ref 0–44)
AST: 10 U/L — ABNORMAL LOW (ref 15–41)
Alkaline Phosphatase: 60 U/L (ref 38–126)
BILIRUBIN TOTAL: 0.8 mg/dL (ref 0.3–1.2)
Bilirubin, Direct: 0.1 mg/dL (ref 0.0–0.2)
Indirect Bilirubin: 0.7 mg/dL (ref 0.3–0.9)
Total Protein: 6.9 g/dL (ref 6.5–8.1)

## 2018-04-10 LAB — PHOSPHORUS: PHOSPHORUS: 3.5 mg/dL (ref 2.5–4.6)

## 2018-04-10 LAB — CBC WITH DIFFERENTIAL/PLATELET
Abs Immature Granulocytes: 0.03 10*3/uL (ref 0.00–0.07)
Basophils Absolute: 0 10*3/uL (ref 0.0–0.1)
Basophils Relative: 0 %
Eosinophils Absolute: 0 10*3/uL (ref 0.0–0.5)
Eosinophils Relative: 0 %
HCT: 44.3 % (ref 39.0–52.0)
Hemoglobin: 15.2 g/dL (ref 13.0–17.0)
Immature Granulocytes: 0 %
Lymphocytes Relative: 13 %
Lymphs Abs: 1.8 10*3/uL (ref 0.7–4.0)
MCH: 28.8 pg (ref 26.0–34.0)
MCHC: 34.3 g/dL (ref 30.0–36.0)
MCV: 84.1 fL (ref 80.0–100.0)
Monocytes Absolute: 0.7 10*3/uL (ref 0.1–1.0)
Monocytes Relative: 5 %
Neutro Abs: 10.6 10*3/uL — ABNORMAL HIGH (ref 1.7–7.7)
Neutrophils Relative %: 82 %
Platelets: 381 10*3/uL (ref 150–400)
RBC: 5.27 MIL/uL (ref 4.22–5.81)
RDW: 13.5 % (ref 11.5–15.5)
WBC: 13.1 10*3/uL — ABNORMAL HIGH (ref 4.0–10.5)
nRBC: 0 % (ref 0.0–0.2)

## 2018-04-10 LAB — TYPE AND SCREEN
ABO/RH(D): B POS
Antibody Screen: NEGATIVE

## 2018-04-10 LAB — GLUCOSE, CAPILLARY
GLUCOSE-CAPILLARY: 241 mg/dL — AB (ref 70–99)
Glucose-Capillary: 256 mg/dL — ABNORMAL HIGH (ref 70–99)
Glucose-Capillary: 186 mg/dL — ABNORMAL HIGH (ref 70–99)
Glucose-Capillary: 176 mg/dL — ABNORMAL HIGH (ref 70–99)
Glucose-Capillary: 224 mg/dL — ABNORMAL HIGH (ref 70–99)
Glucose-Capillary: 248 mg/dL — ABNORMAL HIGH (ref 70–99)
Glucose-Capillary: 476 mg/dL — ABNORMAL HIGH (ref 70–99)

## 2018-04-10 LAB — ABO/RH: ABO/RH(D): B POS

## 2018-04-10 LAB — BASIC METABOLIC PANEL
Anion gap: 13 (ref 5–15)
BUN: 5 mg/dL — ABNORMAL LOW (ref 6–20)
CO2: 28 mmol/L (ref 22–32)
Calcium: 9.4 mg/dL (ref 8.9–10.3)
Chloride: 95 mmol/L — ABNORMAL LOW (ref 98–111)
Creatinine, Ser: 0.71 mg/dL (ref 0.61–1.24)
GFR calc Af Amer: >60 mL/min (ref >60)
GFR calc non Af Amer: >60 mL/min (ref >60)
Glucose, Bld: 191 mg/dL — ABNORMAL HIGH (ref 70–99)
Potassium: 3.4 mmol/L — ABNORMAL LOW (ref 3.5–5.1)
Sodium: 136 mmol/L (ref 135–145)

## 2018-04-10 LAB — SURGICAL PCR SCREEN
MRSA, PCR: NEGATIVE
Staphylococcus aureus: NEGATIVE

## 2018-04-10 LAB — NO BLOOD PRODUCTS

## 2018-04-10 LAB — HEMOGLOBIN A1C
HEMOGLOBIN A1C: 14.9 % — AB (ref 4.8–5.6)
Mean Plasma Glucose: 380.93 mg/dL

## 2018-04-10 LAB — MAGNESIUM: Magnesium: 1.9 mg/dL (ref 1.7–2.4)

## 2018-04-10 LAB — MRSA PCR SCREENING: MRSA by PCR: NEGATIVE

## 2018-04-10 SURGERY — CRANIOTOMY HEMATOMA EVACUATION SUBDURAL
Anesthesia: General | Site: Head | Laterality: Right

## 2018-04-10 MED ORDER — VANCOMYCIN HCL 10 G IV SOLR
2500.0000 mg | Freq: Two times a day (BID) | INTRAVENOUS | Status: DC
  Administered 2018-04-10 – 2018-04-11 (×3): 2500 mg via INTRAVENOUS
  Filled 2018-04-10 (×5): qty 2500

## 2018-04-10 MED ORDER — THROMBIN 20000 UNITS EX SOLR
CUTANEOUS | Status: AC
  Filled 2018-04-10: qty 20000

## 2018-04-10 MED ORDER — ONDANSETRON HCL 4 MG PO TABS: 4.0000 mg | ORAL_TABLET | ORAL | Status: DC | PRN

## 2018-04-10 MED ORDER — PROPOFOL 10 MG/ML IV BOLUS
INTRAVENOUS | Status: AC
  Filled 2018-04-10: qty 20

## 2018-04-10 MED ORDER — THROMBIN 5000 UNITS EX SOLR
CUTANEOUS | Status: DC | PRN
  Administered 2018-04-10: 5000 [IU] via TOPICAL

## 2018-04-10 MED ORDER — HYDROCODONE-ACETAMINOPHEN 5-325 MG PO TABS
1.0000 | ORAL_TABLET | ORAL | Status: DC | PRN
  Administered 2018-04-10 – 2018-04-14 (×9): 2 via ORAL
  Filled 2018-04-10 (×9): qty 2

## 2018-04-10 MED ORDER — SODIUM CHLORIDE 0.9 % IV SOLN: 2.0000 g | INTRAVENOUS | Status: DC

## 2018-04-10 MED ORDER — ROCURONIUM BROMIDE 50 MG/5ML IV SOSY
PREFILLED_SYRINGE | INTRAVENOUS | Status: AC
  Filled 2018-04-10: qty 10

## 2018-04-10 MED ORDER — INSULIN ASPART 100 UNIT/ML ~~LOC~~ SOLN
0.0000 [IU] | SUBCUTANEOUS | Status: DC
  Administered 2018-04-10 (×2): 7 [IU] via SUBCUTANEOUS
  Administered 2018-04-10: 20 [IU] via SUBCUTANEOUS
  Administered 2018-04-11 (×2): 7 [IU] via SUBCUTANEOUS
  Administered 2018-04-11 (×2): 3 [IU] via SUBCUTANEOUS
  Administered 2018-04-11 – 2018-04-12 (×3): 20 [IU] via SUBCUTANEOUS
  Administered 2018-04-12: 11 [IU] via SUBCUTANEOUS

## 2018-04-10 MED ORDER — PROPOFOL 10 MG/ML IV BOLUS
INTRAVENOUS | Status: DC | PRN
  Administered 2018-04-10: 160 mg via INTRAVENOUS
  Administered 2018-04-10: 50 mg via INTRAVENOUS
  Administered 2018-04-10 (×2): 40 mg via INTRAVENOUS

## 2018-04-10 MED ORDER — BACITRACIN ZINC 500 UNIT/GM EX OINT
TOPICAL_OINTMENT | CUTANEOUS | Status: AC
  Filled 2018-04-10: qty 28.35

## 2018-04-10 MED ORDER — HYDROMORPHONE HCL 1 MG/ML IJ SOLN
INTRAMUSCULAR | Status: AC
  Filled 2018-04-10: qty 1

## 2018-04-10 MED ORDER — POTASSIUM CHLORIDE 10 MEQ/100ML IV SOLN: 10.0000 meq | INTRAVENOUS | Status: DC

## 2018-04-10 MED ORDER — THROMBIN 5000 UNITS EX SOLR
CUTANEOUS | Status: AC
  Filled 2018-04-10: qty 5000

## 2018-04-10 MED ORDER — HYDROMORPHONE HCL 1 MG/ML IJ SOLN
0.2500 mg | INTRAMUSCULAR | Status: DC | PRN
  Administered 2018-04-10 (×2): 0.5 mg via INTRAVENOUS

## 2018-04-10 MED ORDER — ACETAMINOPHEN 650 MG RE SUPP: 650.0000 mg | RECTAL | Status: DC | PRN

## 2018-04-10 MED ORDER — GABAPENTIN 600 MG PO TABS
300.0000 mg | ORAL_TABLET | Freq: Three times a day (TID) | ORAL | Status: DC
  Administered 2018-04-10 – 2018-04-14 (×13): 300 mg via ORAL
  Filled 2018-04-10 (×13): qty 1

## 2018-04-10 MED ORDER — ONDANSETRON HCL 4 MG/2ML IJ SOLN
INTRAMUSCULAR | Status: AC
  Filled 2018-04-10: qty 2

## 2018-04-10 MED ORDER — INFLUENZA VAC SPLIT QUAD 0.5 ML IM SUSY
0.5000 mL | PREFILLED_SYRINGE | INTRAMUSCULAR | Status: AC
  Administered 2018-04-11: 0.5 mL via INTRAMUSCULAR
  Filled 2018-04-10 (×2): qty 0.5

## 2018-04-10 MED ORDER — BUPIVACAINE-EPINEPHRINE (PF) 0.25% -1:200000 IJ SOLN
INTRAMUSCULAR | Status: AC
  Filled 2018-04-10: qty 30

## 2018-04-10 MED ORDER — ONDANSETRON HCL 4 MG/2ML IJ SOLN: 4.0000 mg | INTRAMUSCULAR | Status: DC | PRN

## 2018-04-10 MED ORDER — PROMETHAZINE HCL 25 MG/ML IJ SOLN: 6.2500 mg | INTRAMUSCULAR | Status: DC | PRN

## 2018-04-10 MED ORDER — THROMBIN 20000 UNITS EX SOLR
CUTANEOUS | Status: DC | PRN
  Administered 2018-04-10: 20 mL via TOPICAL

## 2018-04-10 MED ORDER — FENTANYL CITRATE (PF) 250 MCG/5ML IJ SOLN
INTRAMUSCULAR | Status: AC
  Filled 2018-04-10: qty 5

## 2018-04-10 MED ORDER — ROCURONIUM BROMIDE 50 MG/5ML IV SOSY
PREFILLED_SYRINGE | INTRAVENOUS | Status: AC
  Filled 2018-04-10: qty 5

## 2018-04-10 MED ORDER — MICROFIBRILLAR COLL HEMOSTAT EX PADS
MEDICATED_PAD | CUTANEOUS | Status: DC | PRN
  Administered 2018-04-10: 1 via TOPICAL

## 2018-04-10 MED ORDER — SUGAMMADEX SODIUM 200 MG/2ML IV SOLN
INTRAVENOUS | Status: DC | PRN
  Administered 2018-04-10: 100 mg via INTRAVENOUS
  Administered 2018-04-10: 265 mg via INTRAVENOUS

## 2018-04-10 MED ORDER — ACETAMINOPHEN 325 MG PO TABS
650.0000 mg | ORAL_TABLET | ORAL | Status: DC | PRN
  Administered 2018-04-13 – 2018-04-14 (×2): 650 mg via ORAL
  Filled 2018-04-10 (×2): qty 2

## 2018-04-10 MED ORDER — PNEUMOCOCCAL VAC POLYVALENT 25 MCG/0.5ML IJ INJ
0.5000 mL | INJECTION | INTRAMUSCULAR | Status: AC
  Administered 2018-04-11: 0.5 mL via INTRAMUSCULAR
  Filled 2018-04-10 (×2): qty 0.5

## 2018-04-10 MED ORDER — DEXAMETHASONE SODIUM PHOSPHATE 10 MG/ML IJ SOLN
INTRAMUSCULAR | Status: DC | PRN
  Administered 2018-04-10: 10 mg via INTRAVENOUS

## 2018-04-10 MED ORDER — LIDOCAINE 2% (20 MG/ML) 5 ML SYRINGE
INTRAMUSCULAR | Status: AC
  Filled 2018-04-10: qty 5

## 2018-04-10 MED ORDER — PHENYLEPHRINE HCL 10 MG/ML IJ SOLN
INTRAMUSCULAR | Status: AC
  Filled 2018-04-10: qty 1

## 2018-04-10 MED ORDER — BISACODYL 10 MG RE SUPP: 10.0000 mg | Freq: Every day | RECTAL | Status: DC | PRN

## 2018-04-10 MED ORDER — SODIUM CHLORIDE 0.9 % IV SOLN
INTRAVENOUS | Status: DC
  Administered 2018-04-10 (×3): via INTRAVENOUS

## 2018-04-10 MED ORDER — LABETALOL HCL 5 MG/ML IV SOLN
10.0000 mg | INTRAVENOUS | Status: DC | PRN
  Administered 2018-04-10: 20 mg via INTRAVENOUS
  Filled 2018-04-10: qty 4

## 2018-04-10 MED ORDER — BUPIVACAINE-EPINEPHRINE 0.25% -1:200000 IJ SOLN
INTRAMUSCULAR | Status: DC | PRN
  Administered 2018-04-10: 10 mL

## 2018-04-10 MED ORDER — FLEET ENEMA 7-19 GM/118ML RE ENEM: 1.0000 | ENEMA | Freq: Once | RECTAL | Status: DC | PRN

## 2018-04-10 MED ORDER — SODIUM CHLORIDE 0.9 % IV SOLN
INTRAVENOUS | Status: DC | PRN
  Administered 2018-04-10: 500 mL

## 2018-04-10 MED ORDER — METRONIDAZOLE IN NACL 5-0.79 MG/ML-% IV SOLN
500.0000 mg | Freq: Three times a day (TID) | INTRAVENOUS | Status: DC
  Administered 2018-04-10 – 2018-04-14 (×13): 500 mg via INTRAVENOUS
  Filled 2018-04-10 (×12): qty 100

## 2018-04-10 MED ORDER — PROMETHAZINE HCL 25 MG PO TABS: 12.5000 mg | ORAL_TABLET | ORAL | Status: DC | PRN

## 2018-04-10 MED ORDER — PANTOPRAZOLE SODIUM 40 MG IV SOLR
40.0000 mg | Freq: Every day | INTRAVENOUS | Status: DC
  Administered 2018-04-10 – 2018-04-12 (×3): 40 mg via INTRAVENOUS
  Filled 2018-04-10 (×5): qty 40

## 2018-04-10 MED ORDER — ROCURONIUM BROMIDE 50 MG/5ML IV SOSY
PREFILLED_SYRINGE | INTRAVENOUS | Status: DC | PRN
  Administered 2018-04-10: 50 mg via INTRAVENOUS
  Administered 2018-04-10 (×2): 20 mg via INTRAVENOUS
  Administered 2018-04-10 (×2): 30 mg via INTRAVENOUS

## 2018-04-10 MED ORDER — MEPERIDINE HCL 50 MG/ML IJ SOLN: 6.2500 mg | INTRAMUSCULAR | Status: DC | PRN

## 2018-04-10 MED ORDER — DEXAMETHASONE SODIUM PHOSPHATE 10 MG/ML IJ SOLN
INTRAMUSCULAR | Status: AC
  Filled 2018-04-10: qty 1

## 2018-04-10 MED ORDER — POTASSIUM CHLORIDE CRYS ER 20 MEQ PO TBCR
40.0000 meq | EXTENDED_RELEASE_TABLET | Freq: Two times a day (BID) | ORAL | Status: AC
  Administered 2018-04-10 – 2018-04-11 (×2): 40 meq via ORAL
  Filled 2018-04-10 (×2): qty 2

## 2018-04-10 MED ORDER — ONDANSETRON HCL 4 MG PO TABS: 4.0000 mg | ORAL_TABLET | Freq: Four times a day (QID) | ORAL | Status: DC | PRN

## 2018-04-10 MED ORDER — POTASSIUM CHLORIDE IN NACL 20-0.9 MEQ/L-% IV SOLN
INTRAVENOUS | Status: DC
  Administered 2018-04-10 – 2018-04-11 (×4): via INTRAVENOUS
  Filled 2018-04-10 (×4): qty 1000

## 2018-04-10 MED ORDER — INSULIN GLARGINE 100 UNIT/ML ~~LOC~~ SOLN
35.0000 [IU] | Freq: Every day | SUBCUTANEOUS | Status: DC
  Filled 2018-04-10: qty 0.35

## 2018-04-10 MED ORDER — HYDRALAZINE HCL 20 MG/ML IJ SOLN
10.0000 mg | INTRAMUSCULAR | Status: DC | PRN
  Filled 2018-04-10: qty 1

## 2018-04-10 MED ORDER — METFORMIN HCL 500 MG PO TABS
500.0000 mg | ORAL_TABLET | Freq: Two times a day (BID) | ORAL | Status: DC
  Administered 2018-04-10 – 2018-04-14 (×8): 500 mg via ORAL
  Filled 2018-04-10 (×8): qty 1

## 2018-04-10 MED ORDER — ONDANSETRON HCL 4 MG/2ML IJ SOLN
INTRAMUSCULAR | Status: DC | PRN
  Administered 2018-04-10: 4 mg via INTRAVENOUS

## 2018-04-10 MED ORDER — BACITRACIN ZINC 500 UNIT/GM EX OINT
TOPICAL_OINTMENT | CUTANEOUS | Status: DC | PRN
  Administered 2018-04-10: 1 via TOPICAL

## 2018-04-10 MED ORDER — SUGAMMADEX SODIUM 500 MG/5ML IV SOLN
INTRAVENOUS | Status: AC
  Filled 2018-04-10: qty 5

## 2018-04-10 MED ORDER — INSULIN ASPART 100 UNIT/ML ~~LOC~~ SOLN
SUBCUTANEOUS | Status: AC
  Filled 2018-04-10: qty 1

## 2018-04-10 MED ORDER — MORPHINE SULFATE (PF) 4 MG/ML IV SOLN
4.0000 mg | INTRAVENOUS | Status: DC | PRN
  Administered 2018-04-10 – 2018-04-11 (×4): 4 mg via INTRAVENOUS
  Filled 2018-04-10 (×4): qty 1

## 2018-04-10 MED ORDER — MIDAZOLAM HCL 2 MG/2ML IJ SOLN
INTRAMUSCULAR | Status: AC
  Filled 2018-04-10: qty 2

## 2018-04-10 MED ORDER — FENTANYL CITRATE (PF) 100 MCG/2ML IJ SOLN
INTRAMUSCULAR | Status: DC | PRN
  Administered 2018-04-10: 150 ug via INTRAVENOUS
  Administered 2018-04-10 (×2): 100 ug via INTRAVENOUS

## 2018-04-10 MED ORDER — ONDANSETRON HCL 4 MG/2ML IJ SOLN: 4.0000 mg | Freq: Four times a day (QID) | INTRAMUSCULAR | Status: DC | PRN

## 2018-04-10 MED ORDER — INSULIN GLARGINE 100 UNIT/ML ~~LOC~~ SOLN
35.0000 [IU] | Freq: Every day | SUBCUTANEOUS | Status: DC
  Administered 2018-04-10 – 2018-04-11 (×2): 35 [IU] via SUBCUTANEOUS
  Filled 2018-04-10 (×3): qty 0.35

## 2018-04-10 MED ORDER — LIDOCAINE 2% (20 MG/ML) 5 ML SYRINGE
INTRAMUSCULAR | Status: DC | PRN
  Administered 2018-04-10: 50 mg via INTRAVENOUS

## 2018-04-10 MED ORDER — DOCUSATE SODIUM 100 MG PO CAPS
100.0000 mg | ORAL_CAPSULE | Freq: Two times a day (BID) | ORAL | Status: DC
  Administered 2018-04-10 – 2018-04-14 (×8): 100 mg via ORAL
  Filled 2018-04-10 (×8): qty 1

## 2018-04-10 MED ORDER — 0.9 % SODIUM CHLORIDE (POUR BTL) OPTIME
TOPICAL | Status: DC | PRN
  Administered 2018-04-10 (×2): 1000 mL

## 2018-04-10 MED ORDER — SODIUM CHLORIDE 0.9 % IV SOLN
2.0000 g | Freq: Two times a day (BID) | INTRAVENOUS | Status: DC
  Administered 2018-04-10 – 2018-04-14 (×9): 2 g via INTRAVENOUS
  Filled 2018-04-10 (×10): qty 20

## 2018-04-10 SURGICAL SUPPLY — 73 items
BAG DECANTER FOR FLEXI CONT (MISCELLANEOUS) IMPLANT
BATTERY IQ STERILE (MISCELLANEOUS) ×2 IMPLANT
BIT DRILL WIRE PASS 1.3MM (BIT) IMPLANT
BLADE CLIPPER SPEC (BLADE) ×3 IMPLANT
BTRY SRG DRVR 1.5 IQ (MISCELLANEOUS) ×1
BUR ACORN 6.0 ACORN (BURR) ×1 IMPLANT
BUR ACORN 6.0MM ACORN (BURR) ×1
BUR PRECISION FLUTE 6.0 (BURR) ×3 IMPLANT
BUR SPIRAL ROUTER 2.3 (BUR) IMPLANT
BUR SPIRAL ROUTER 2.3MM (BUR)
CANISTER SUCT 3000ML PPV (MISCELLANEOUS) ×3 IMPLANT
CARTRIDGE OIL MAESTRO DRILL (MISCELLANEOUS) ×1 IMPLANT
CLIP VESOCCLUDE MED 6/CT (CLIP) IMPLANT
COVER BACK TABLE 60X90IN (DRAPES) IMPLANT
COVER WAND RF STERILE (DRAPES) ×3 IMPLANT
DIFFUSER DRILL AIR PNEUMATIC (MISCELLANEOUS) ×3 IMPLANT
DRAPE NEUROLOGICAL W/INCISE (DRAPES) ×3 IMPLANT
DRAPE SURG 17X23 STRL (DRAPES) IMPLANT
DRAPE WARM FLUID 44X44 (DRAPE) ×3 IMPLANT
DRILL WIRE PASS 1.3MM (BIT)
ELECT REM PT RETURN 9FT ADLT (ELECTROSURGICAL) ×3
ELECTRODE REM PT RTRN 9FT ADLT (ELECTROSURGICAL) ×1 IMPLANT
EVACUATOR 1/8 PVC DRAIN (DRAIN) IMPLANT
EVACUATOR SILICONE 100CC (DRAIN) IMPLANT
FORCEPS BIPOLAR SPETZLER 8 1.0 (NEUROSURGERY SUPPLIES) ×2 IMPLANT
GAUZE 4X4 16PLY RFD (DISPOSABLE) IMPLANT
GAUZE SPONGE 4X4 12PLY STRL (GAUZE/BANDAGES/DRESSINGS) ×2 IMPLANT
GLOVE BIO SURGEON STRL SZ 6.5 (GLOVE) ×1 IMPLANT
GLOVE BIO SURGEON STRL SZ7 (GLOVE) ×6 IMPLANT
GLOVE BIO SURGEON STRL SZ8 (GLOVE) ×3 IMPLANT
GLOVE BIO SURGEON STRL SZ8.5 (GLOVE) ×3 IMPLANT
GLOVE BIO SURGEONS STRL SZ 6.5 (GLOVE) ×1
GLOVE BIOGEL PI IND STRL 6.5 (GLOVE) IMPLANT
GLOVE BIOGEL PI IND STRL 7.5 (GLOVE) IMPLANT
GLOVE BIOGEL PI INDICATOR 6.5 (GLOVE) ×2
GLOVE BIOGEL PI INDICATOR 7.5 (GLOVE) ×4
GLOVE EXAM NITRILE XL STR (GLOVE) IMPLANT
GOWN STRL REUS W/ TWL LRG LVL3 (GOWN DISPOSABLE) IMPLANT
GOWN STRL REUS W/ TWL XL LVL3 (GOWN DISPOSABLE) IMPLANT
GOWN STRL REUS W/TWL LRG LVL3 (GOWN DISPOSABLE) ×9
GOWN STRL REUS W/TWL XL LVL3 (GOWN DISPOSABLE)
KIT BASIN OR (CUSTOM PROCEDURE TRAY) ×3 IMPLANT
KIT CLIP RANEY GUN (KITS) IMPLANT
KIT TURNOVER KIT B (KITS) ×3 IMPLANT
MARKER SKIN DUAL TIP RULER LAB (MISCELLANEOUS) IMPLANT
NEEDLE HYPO 22GX1.5 SAFETY (NEEDLE) ×3 IMPLANT
NS IRRIG 1000ML POUR BTL (IV SOLUTION) ×3 IMPLANT
OIL CARTRIDGE MAESTRO DRILL (MISCELLANEOUS) ×3
PACK CRANIOTOMY CUSTOM (CUSTOM PROCEDURE TRAY) ×3 IMPLANT
PAD ARMBOARD 7.5X6 YLW CONV (MISCELLANEOUS) ×3 IMPLANT
PATTIES SURGICAL .25X.25 (GAUZE/BANDAGES/DRESSINGS) IMPLANT
PATTIES SURGICAL .5 X.5 (GAUZE/BANDAGES/DRESSINGS) IMPLANT
PATTIES SURGICAL .5 X3 (DISPOSABLE) IMPLANT
PATTIES SURGICAL 1X1 (DISPOSABLE) IMPLANT
PIN MAYFIELD SKULL DISP (PIN) IMPLANT
PLATE 1.5  2HOLE LNG NEURO (Plate) ×6 IMPLANT
PLATE 1.5 2HOLE LNG NEURO (Plate) IMPLANT
RUBBERBAND STERILE (MISCELLANEOUS) ×6 IMPLANT
SCREW SELF DRILL HT 1.5/4MM (Screw) ×12 IMPLANT
SPONGE NEURO XRAY DETECT 1X3 (DISPOSABLE) IMPLANT
STAPLER SKIN PROX WIDE 3.9 (STAPLE) ×3 IMPLANT
SUT ETHILON 3 0 FSL (SUTURE) IMPLANT
SUT NURALON 4 0 TR CR/8 (SUTURE) ×6 IMPLANT
SUT PROLENE 6 0 BV (SUTURE) IMPLANT
SUT VIC AB 2-0 CP2 18 (SUTURE) ×3 IMPLANT
SUT VIC AB 3-0 FS2 27 (SUTURE) ×3 IMPLANT
SUT VICRYL 4-0 PS2 18IN ABS (SUTURE) IMPLANT
TAPE CLOTH SURG 4X10 WHT LF (GAUZE/BANDAGES/DRESSINGS) ×2 IMPLANT
TOWEL GREEN STERILE (TOWEL DISPOSABLE) ×3 IMPLANT
TOWEL GREEN STERILE FF (TOWEL DISPOSABLE) ×3 IMPLANT
TRAY FOLEY MTR SLVR 16FR STAT (SET/KITS/TRAYS/PACK) IMPLANT
UNDERPAD 30X30 (UNDERPADS AND DIAPERS) IMPLANT
WATER STERILE IRR 1000ML POUR (IV SOLUTION) ×3 IMPLANT

## 2018-04-10 NOTE — Progress Notes (Signed)
Pharmacy Antibiotic Note  [name] is a 23 y.o. male admitted on 04/09/2018 with brain abscess.  Pharmacy has been consulted for Vancomycin dosing. Recent dental work. WBC elevated. Renal function Ok. Planning for surgery when medically optimized (CBGs in the 400s).   Plan: Vancomycin 2500 mg IV q12h >>Estimated AUC: 494 Ceftriaxone 2g IV q12h Flagyl 500 mg IV q8h Trend WBC, temp, renal function  F/U infectious work-up Drug levels as indicated   Height: 6\' 8"  (203.2 cm) Weight: 292 lb (132.5 kg) IBW/kg (Calculated) : 96  Temp (24hrs), Avg:98.9 F (37.2 C), Min:98.9 F (37.2 C), Max:98.9 F (37.2 C)  Recent Labs  Lab 04/09/18 1715  WBC 16.2*  CREATININE 0.88    Estimated Creatinine Clearance: 206 mL/min (by C-G formula based on SCr of 0.88 mg/dL).    No Known Allergies   Mason Pratt 04/10/2018 12:58 AM

## 2018-04-10 NOTE — H&P (Signed)
History and Physical    Mason Pratt:[id number] DOB: [birth year] DOA: 04/09/2018  PCP: System, Provider Not In  Patient coming from: Home  Chief Complaint: Abnormal CAT scan of his brain  HPI: Mason Pratt is a 23 y.o. male with medical history significant of seizure disorder, mental disorder, hypertension, hyperlipidemia with recent extraction of his teeth was on antibiotics comes in with an abnormal CAT scan that was done by neurology as an outpatient.  Patient has been having some breakthrough seizures which is highly unusual for him.  All history is obtained from his mother.  His mother states that he started having seizures over 2 weeks ago.  He had an MRI at that time which was normal.  He subsequently had follow-up with neurology who did a repeat CT of his head which shows a temporal lobe abscess.  Patient is also been suffering from persistent headaches that have worsened.  Patient was sent to the ED neurosurgery evaluated patient plan on doing a craniotomy today with getting a culture of his brain abscess however anesthesiology did not feel comfortable doing so because of his pseudohyponatremia and uncontrolled diabetes.  Neurosurgery requested critical care admission in the ICU overnight however critical care team has deemed patient and appropriate for ICU level of care at this time.  Patient was then referred to [facility] hospitalist for admission.  Plan is to take him to the OR in the morning at 9:00 after his sugar has been addressed.    Review of Systems: As per HPI otherwise 10 point review of systems negative.   Past Medical History:  Diagnosis Date  . ADD (attention deficit disorder)   . ADHD (attention deficit hyperactivity disorder)   . Arthritis    "knees" (03/18/2018"  . Cheek mass    "noted when he had wisdom teeth taken out 03/02/2018" (03/18/2018)  . Childhood asthma   . Hyperlipidemia   . Hypertension   . Mental disorder    "EMD:  emotional mental disorder; dx'd  at age 68 months" (03/18/2018)  . Seizure [facility]) 1997; 03/18/2018   "was told that indicated that he would be a special needs child;"  . Type II diabetes mellitus ([place])     Past Surgical History:  Procedure Laterality Date  . TOOTH EXTRACTION Bilateral 03/02/2018   Procedure: DENTAL RESTORATION/EXTRACTIONS;  Surgeon: Diona Browner, DDS;  Location: [place];  Service: Oral Surgery;  Laterality: Bilateral;     reports that he has never smoked. He has never used smokeless tobacco. He reports that he does not drink alcohol or use drugs.  No Known Allergies  Family History  Problem Relation Age of Onset  . Headache Mother   . Seizures Brother   . Headache Brother   . Alzheimer's disease Other        mat great grandmother, maternal great uncle, great aunts on mother's side   . Fibromyalgia Other        mother's side of the family     Prior to Admission medications   Medication Sig Start Date End Date Taking? Authorizing Provider  divalproex (DEPAKOTE ER) 500 MG 24 hr tablet Take 2 tablets (1,000 mg total) by mouth daily. 04/01/18  Yes Melvenia Beam, MD  gabapentin (NEURONTIN) 300 MG capsule Take 1 capsule (300 mg total) by mouth 2 (two) times daily. 04/01/18  Yes Melvenia Beam, MD  ibuprofen (ADVIL,MOTRIN) 800 MG tablet Take 1 tablet (800 mg total) by mouth 3 (three) times daily. Patient taking differently:  Take 800 mg by mouth daily as needed for moderate pain.  03/16/18  Yes Khatri, Hina, PA-C  insulin aspart (NOVOLOG) 100 UNIT/ML injection Substitute to any brand approved.Before each meal 3 times a day, 140-199 - 2 units, 200-250 - 4 units, 251-299 - 6 units,  300-349 - 8 units,  350 or above 10 units. Dispense syringes and needles as needed, Ok to switch to PEN if approved. DX DM2, Code E11.65 Patient taking differently: Inject 2-10 Units into the skin See admin instructions. Sliding Scale: 140-199 - 2 units, 200-250 - 4 units, 251-299 - 6 units,  300-349 - 8 units,  350 or above 10  units. 03/19/18  Yes Thurnell Lose, MD  insulin glargine (LANTUS) 100 UNIT/ML injection Inject 0.3 mLs (30 Units total) into the skin at bedtime. Dispense insulin pen if approved, if not dispense as needed syringes and needles for 1 month supply. Can switch to Levemir. Diagnosis E 11.65. Patient taking differently: Inject 35 Units into the skin at bedtime.  03/19/18  Yes Thurnell Lose, MD  metFORMIN (GLUCOPHAGE) 500 MG tablet Take 1 tablet (500 mg total) by mouth 2 (two) times daily with a meal. 03/19/18  Yes Thurnell Lose, MD  blood glucose meter kit and supplies KIT Dispense based on patient and insurance preference. Use up to four times daily as directed. (FOR ICD-9 250.00, 250.01). For QAC - HS accuchecks. 03/19/18   Thurnell Lose, MD  Insulin Syringe-Needle U-100 25G X 1" 1 ML MISC For 4 times a day insulin SQ, 1 month supply. Diagnosis E11.65 03/19/18   Thurnell Lose, MD  oxyCODONE-acetaminophen (PERCOCET) 5-325 MG tablet Take 1 tablet by mouth every 4 (four) hours as needed. Patient not taking: Reported on 03/18/2018 03/02/18   Diona Browner, DDS    Physical Exam: Vitals:   04/09/18 2045 04/09/18 2130 04/09/18 2200 04/09/18 2345  BP: (!) 160/90 138/65 118/64 (!) 181/96  Pulse: (!) 113 (!) 112 (!) 113 94  Resp:      Temp:      TempSrc:      SpO2: 98% 97% 98% 97%  Weight:      Height:          Constitutional: NAD, calm, comfortable Vitals:   04/09/18 2045 04/09/18 2130 04/09/18 2200 04/09/18 2345  BP: (!) 160/90 138/65 118/64 (!) 181/96  Pulse: (!) 113 (!) 112 (!) 113 94  Resp:      Temp:      TempSrc:      SpO2: 98% 97% 98% 97%  Weight:      Height:       Eyes: PERRL, lids and conjunctivae normal ENMT: Mucous membranes are moist. Posterior pharynx clear of any exudate or lesions.Normal dentition.  Neck: normal, supple, no masses, no thyromegaly Respiratory: clear to auscultation bilaterally, no wheezing, no crackles. Normal respiratory effort. No  accessory muscle use.  Cardiovascular: Regular rate and rhythm, no murmurs / rubs / gallops. No extremity edema. 2+ pedal pulses. No carotid bruits.  Abdomen: no tenderness, no masses palpated. No hepatosplenomegaly. Bowel sounds positive.  Musculoskeletal: no clubbing / cyanosis. No joint deformity upper and lower extremities. Good ROM, no contractures. Normal muscle tone.  Skin: no rashes, lesions, ulcers. No induration Neurologic: CN 2-12 grossly intact. Sensation intact, DTR normal. Strength 5/5 in all 4.  Psychiatric: Normal judgment and insight. Alert and oriented x 3. Normal mood.    Labs on Admission: I have personally reviewed following labs and imaging studies  CBC: Recent Labs  Lab 04/09/18 1715  WBC 16.2*  NEUTROABS 13.6*  HGB 15.0  HCT 45.8  MCV 84.5  PLT 950   Basic Metabolic Panel: Recent Labs  Lab 04/09/18 1715  NA 125*  K 4.1  CL 89*  CO2 23  GLUCOSE 512*  BUN 9  CREATININE 0.88  CALCIUM 9.4   GFR: Estimated Creatinine Clearance: 206 mL/min (by C-G formula based on SCr of 0.88 mg/dL). Liver Function Tests: Recent Labs  Lab 04/09/18 1715  AST 11*  ALT 10  ALKPHOS 80  BILITOT 0.8  PROT 7.1  ALBUMIN 3.8   No results for input(s): LIPASE, AMYLASE in the last 168 hours. No results for input(s): AMMONIA in the last 168 hours. Coagulation Profile: No results for input(s): INR, PROTIME in the last 168 hours. Cardiac Enzymes: No results for input(s): CKTOTAL, CKMB, CKMBINDEX, TROPONINI in the last 168 hours. BNP (last 3 results) No results for input(s): PROBNP in the last 8760 hours. HbA1C: No results for input(s): HGBA1C in the last 72 hours. CBG: Recent Labs  Lab 04/09/18 2009 04/09/18 2110 04/09/18 2222  GLUCAP 309* 402* 449*   Lipid Profile: No results for input(s): CHOL, HDL, LDLCALC, TRIG, CHOLHDL, LDLDIRECT in the last 72 hours. Thyroid Function Tests: No results for input(s): TSH, T4TOTAL, FREET4, T3FREE, THYROIDAB in the last 72  hours. Anemia Panel: No results for input(s): VITAMINB12, FOLATE, FERRITIN, TIBC, IRON, RETICCTPCT in the last 72 hours. Urine analysis:    Component Value Date/Time   COLORURINE STRAW (A) 03/18/2018 0859   APPEARANCEUR CLEAR 03/18/2018 0859   LABSPEC 1.025 03/18/2018 0859   PHURINE 5.0 03/18/2018 0859   GLUCOSEU >=500 (A) 03/18/2018 0859   HGBUR NEGATIVE 03/18/2018 0859   BILIRUBINUR NEGATIVE 03/18/2018 0859   KETONESUR 20 (A) 03/18/2018 0859   PROTEINUR NEGATIVE 03/18/2018 0859   UROBILINOGEN 0.2 01/14/2007 1021   NITRITE NEGATIVE 03/18/2018 0859   LEUKOCYTESUR NEGATIVE 03/18/2018 0859   Sepsis Labs: !!!!!!!!!!!!!!!!!!!!!!!!!!!!!!!!!!!!!!!!!!!! '@LABRCNTIP'$ (procalcitonin:4,lacticidven:4) )No results found for this or any previous visit (from the past 240 hour(s)).   Radiological Exams on Admission: Ct Head W & Wo Contrast  Result Date: 04/09/2018  [facility] [address], [place], [place] [address] [phone] NEUROIMAGING REPORT STUDY DATE: 04/08/18 PATIENT NAME: SURYA SCHROETER DOB: 04-06-1995 MRN: [id number] ORDERING CLINICIAN: Dr Jaynee Eagles CLINICAL HISTORY: 22 year patient with headache COMPARISON FILMS: MRI Brain w/wo 03/18/2018 EXAM: CT Head w/wo TECHNIQUE: CT scan of the head was obtained utilizing 5 mm axial slices from the skull base to the vertex. CONTRAST:  75 ml iv omnipaque IMAGING SITE: [facility] FINDINGS: Deep brain parenchyma shows a well-circumscribed area of hypodensity in the right anterior temporal lobe which measures 2.2 mm in the AP, 2.2 in the transverse and 1.8 mm in the vertical dimension.  There is faint enhancement after contrast particularly in the posterior margin.  There is mild surrounding cytotoxic edema, but no significant compression of the temporal horn or hydrocephalus.  Rest of the brain parenchyma appears unremarkable.  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  No  other structural lesion tumor or infarcts are noted.  The ventricular system appear unremarkable.  Calvarium shows no abnormalities.  There does not appear to be significant bony involvement of the temporal  bone.   Abnormal CT scan of the brain with and without contrast showing circumscribed hypodensity in the right temporal lobe with faint enhancement likely compatible with subacute  brain abscess.  Significant change compared with previous MRI dated 03/18/2018. INTERPRETING PHYSICIAN: Antony Contras, MD Certified in  Neuroimaging by [employer] [place] [employer] and [employer]specialities   Old chart reviewed Case discussed with Dr. Milus Height in the ED  Assessment/Plan 23 year old diabetic hypertensive male comes in with recent dental infection found to have a small brain abscess Principal Problem:   Brain abscess-placed on IV vancomycin Rocephin and Flagyl.  Patient to go to the OR in the morning at 9 AM to get culture data which will be affected by antibiotics probably.  Follow-up on culture data.  Patient stable without any neurological issues at this time.  He has not had a seizure in over 2 weeks.  Active Problems:   Seizures (HCC)-stable at this time.  Continue oral antiepileptics once taking p.o.  Currently n.p.o. after midnight.  Unsure if the small brain abscess is contributing to breakthrough seizures but possible.  Treatment as above.    DM (diabetes mellitus), type 2 (HCC)-continue Lantus and place on sliding scale insulin    Dental abscess-noted    Essential hypertension-provide PRN hydralazine order IV with parameters for now since he is n.p.o.    Pseudohyponatremia-corrected to almost normal.  Correct glucose.  Mental disability-noted and mild     DVT prophylaxis: SCDs Code Status: Full Family Communication: Mother Disposition Plan: 1 to 3 days Consults called: Neurosurgery, and PCCM who is signed off Admission status:  Admission   Ornella Coderre A MD [facility]  If 7PM-7AM, please contact night-coverage [email or web address] Password [facility]  04/10/2018, 12:46 AM

## 2018-04-10 NOTE — Anesthesia Procedure Notes (Addendum)
Arterial Line Insertion Start/End1/18/2020 10:46 AM, 04/10/2018 10:48 AM Performed by: Shelton Silvas, MD, anesthesiologist  Patient location: Pre-op. Preanesthetic checklist: patient identified, IV checked, site marked, risks and benefits discussed, surgical consent, monitors and equipment checked, pre-op evaluation, timeout performed and anesthesia consent Lidocaine 1% used for infiltration Left, radial was placed Catheter size: 20 Fr Hand hygiene performed  and maximum sterile barriers used   Attempts: 1 Procedure performed without using ultrasound guided technique. Following insertion, dressing applied. Post procedure assessment: normal and unchanged  Patient tolerated the procedure well with no immediate complications.

## 2018-04-10 NOTE — Progress Notes (Addendum)
Patient and alert and oriented to person, place, event, and time. Mother states that she does not want patient to receive blood, however, when talking to patient about getting blood he does not want his life to be at risk by not receiving blood.   In preop patient understands type of surgery and reason why he is having surgery. Of note consent was signed by mother on 04/09/2018. I was not with patient during that time and cannot speak to his mental status then.   Patient was getting frustrated with his mother's insistence or refusing blood. Discussed with OR and anesthesia. Patient will be taken back to OR without being given sedation. OR/ CRNA/ Dr. Hart Rochester to have discussion with patient away from mother about blood.   After patient left Short Stay I confirmed with mother that she does have legal guardianship over patient. I discussed that if she felt patient was unable to make decisions for himself she needs to speak to patient's MD about the process of getting a legal guardianship. I further explained that we never want to violate any religious beliefs but that patient appeared to be able to make decisions for himself. Mother understood and would support what patient decided.   (Note addendum made after original note was signed to include additional details.)

## 2018-04-10 NOTE — Anesthesia Procedure Notes (Signed)
Procedure Name: Intubation Date/Time: 04/10/2018 11:00 AM Performed by: Julieta Bellini, CRNA Pre-anesthesia Checklist: Patient identified, Emergency Drugs available, Suction available and Patient being monitored Patient Re-evaluated:Patient Re-evaluated prior to induction Oxygen Delivery Method: Circle system utilized Preoxygenation: Pre-oxygenation with 100% oxygen Induction Type: IV induction Ventilation: Mask ventilation without difficulty Laryngoscope Size: Mac and 4 Grade View: Grade I Tube type: Oral Tube size: 7.5 mm Number of attempts: 1 Airway Equipment and Method: Stylet Placement Confirmation: ETT inserted through vocal cords under direct vision,  positive ETCO2 and breath sounds checked- equal and bilateral Secured at: 22 cm Tube secured with: Tape Dental Injury: Teeth and Oropharynx as per pre-operative assessment

## 2018-04-10 NOTE — Anesthesia Postprocedure Evaluation (Signed)
Anesthesia Post Note  Patient: Mason Pratt  Procedure(s) Performed: RIGHT CRANIOTOMY FOR BRAIN ABSCESS (Right Head)     Patient location during evaluation: PACU Anesthesia Type: General Level of consciousness: awake and alert Pain management: pain level controlled Vital Signs Assessment: post-procedure vital signs reviewed and stable Respiratory status: spontaneous breathing, nonlabored ventilation, respiratory function stable and patient connected to nasal cannula oxygen Cardiovascular status: blood pressure returned to baseline and stable Postop Assessment: no apparent nausea or vomiting Anesthetic complications: no    Last Vitals:  Vitals:   04/10/18 1315 04/10/18 1356  BP:  (!) 152/90  Pulse:    Resp:    Temp:  37.8 C  SpO2: 91%     Last Pain:  Vitals:   04/10/18 1356  TempSrc: Axillary  PainSc: 0-No pain                 Shelton Silvas

## 2018-04-10 NOTE — Progress Notes (Signed)
The patient was admitted early this morning after midnight and H&P has been reviewed and I am in current agreement with assessment and plan done by Dr. Tarry Kos.  Additional changes the plan of care been made accordingly.  The patient is a 23 year old African-American male with a past medical history significant for seizure disorder, mental delay and disorder, hypertension, hyperlipidemia, with recent extraction of his teeth and he was on antibiotics who presented with an abnormal CAT scan done by neurology and outpatient for a seizure follow-up.  He has been having some breakthrough seizures which is unusual from him and started having seizures over 2 weeks ago.  He had an MRI at that time which was normal I subsequently followed up with neurology who did repeat CT scan of his head which shows a temporal lobe abscess.  Patient was also suffering from persistent headaches that have worsened.  Neurosurgery was consulted for evaluation and recommendations and they were planning on taking the patient for craniotomy for evacuation of the abscess and obtaining cultures.  Patient has been placed on vancomycin, ceftriaxone and Flagyl.  8 from yesterday until today because of his uncontrolled hyperglycemia.  Patient was placed on Lantus and a sliding scale and his blood sugars have now improved.  Continue his antiepileptics and follow further neurosurgical recommendations.  Patient sodium level is low on admission was likely secondary to pseudohyponatremia in setting of uncontrolled hyperglycemia..  We will follow-up on the culture data obtained by neurosurgery and continue to monitor patient's clinical course and response to intervention and repeat blood work in the a.m.

## 2018-04-10 NOTE — Transfer of Care (Signed)
Immediate Anesthesia Transfer of Care Note  Patient: Mason Pratt  Procedure(s) Performed: RIGHT CRANIOTOMY FOR BRAIN ABSCESS (Right Head)  Patient Location: PACU  Anesthesia Type:General  Level of Consciousness: awake and alert   Airway & Oxygen Therapy: Patient Spontanous Breathing  Post-op Assessment: Report given to RN  Post vital signs: Reviewed and stable  Last Vitals:  Vitals Value Taken Time  BP    Temp    Pulse    Resp    SpO2      Last Pain:  Vitals:   04/10/18 0808  TempSrc: Oral  PainSc: 0-No pain         Complications: No apparent anesthesia complications

## 2018-04-10 NOTE — Progress Notes (Signed)
PROGRESS NOTE    Mason BuffReece A Schmeling  UJW:[id number] DOB: 08-05-1995 DOA: 04/09/2018 PCP: System, Provider Not In   Brief Narrative:  The patient is a 23 year old African-American male with a past medical history significant for seizure disorder, mental delay and disorder, hypertension, hyperlipidemia, with recent extraction of his teeth and he was on antibiotics who presented with an abnormal CAT scan done by neurology and outpatient for a seizure follow-up.  He has been having some breakthrough seizures which is unusual from him and started having seizures over 2 weeks ago.  He had an MRI at that time which was normal I subsequently followed up with neurology who did repeat CT scan of his head which shows a temporal lobe abscess.  Patient was also suffering from persistent headaches that have worsened.  Neurosurgery was consulted for evaluation and recommendations and they took the patient for craniotomy for evacuation of the abscess and obtaining cultures.  Patient has been placed on vancomycin, ceftriaxone and Flagyl.   Patient was placed on Lantus and a sliding scale and his blood sugars have now improved.  Continue his antiepileptics and follow further neurosurgical recommendations.  Patient sodium level is low on admission was likely secondary to pseudohyponatremia in setting of uncontrolled hyperglycemia. ID was consulted for further Abx Assistance. We will follow-up on the culture data obtained by neurosurgery and continue to monitor patient's clinical course and response to intervention.   Assessment & Plan:   Principal Problem:   Brain abscess Active Problems:   Seizures (HCC)   DM (diabetes mellitus), type 2 (HCC)   Dental abscess   Essential hypertension   pseudohyponatremia   Brain mass  Brain Abscess due to Dental Infection s/p craniotomy for drainage of the right temporal brain abscess -Postoperative day 1 -Patient doing well -ID consulted for further evaluation recommendations  continue vancomycin ceftriaxone and metronidazole for now -Continue follow-up intraoperative cultures -Per ID he will need a PICC line placement -Needs at least 6 to 8 weeks of IV antibiotics with repeat imaging to assure that the brain abscess is fully resolved -Postoperative Pain Control with po Acetaminophen 650 mg po q4hprn Mild Pain, Hydrocodone-Acetaminophen 1-2 tabs po q4hprn Moderate Pain, and 4 mg q2hprn Severe Pain -C/w Antiemetics with Zofran 4 mg po q4hprn Nausea and Vomiting -C/w Bowel Regimen with Bisacodyl Suppository 10 mg Dailyprn, and Docusate 100 mg po BID  Leukocytosis -In the setting of Surgery -WBC went from 13.1 -> 18.0 -C/w IV Abx per ID -Continue to Monitor for S/Sx of Infection -Repeat CBC in AM   Dental Abscess -Will need follow up with Dentistry   HTN  -Continue with hydralazine 10 mg IV every 4 PRN for high blood pressure for systolic blood pressure greater than 160 or diastolic pressure than 110 and hold for heart rate greater than 90 -Continue labetalol 10 mg every 2 as needed for high blood pressure maintain systolic blood pressure less than 160 and heart rate less than 120  Seizures -Currently stable at this time -Continue to provoke the Divalproex 1000 mg po Daily -C/w Gabapentin 300 mg po TID  Hyponatremia/Hypochloremia -Recently had Pseudohyponatremia in the setting of Hyperglycemia -Continue to Control Blood Sugars -Patient's Na+ went from 136 -> 133 -C/w on low dose IVF with NS at 75 mL/hr + 20 mEQ KCl -Continue to Monitor and Trend   Hypokalemia -Patient's K+ yesterday was 3.4; Repeat K+ today was 3.4 -Replete with poKCl 40 mEQ BID x2 doses yesterday -Continue to Monitor and Replete as Necessary -  Repeat CMP in AM   Uncontrolled Diabetes Mellitus Type 2 -HbA1c was 14.9 -Placed on Lantus 35 units sq qHS and Resistant Novolog SSI q4h -Continue to Monitor CBG's closely; CBG's ranging from: 144-476  -Metformin 500 mg po BID resumed -Will  consult Diabetes Education Coordinator for Further evaluation and Management for diabetic teaching  Obesity -Estimated body mass index is 32.45 kg/m as calculated from the following:   Height as of this encounter: 6\' 8"  (2.032 m).   Weight as of this encounter: 134 kg. -Weight Loss Counseling Given  Mental Disability and Delay -Noted and Mild   DVT prophylaxis: SCDs Code Status: FULL CODE Family Communication: Discussed with Mother at bedside  Disposition Plan: (specify when and where you expect patient to be discharged). Include barriers to DC in this tab.  Consultants:   Infectious Diseases  Neurosurgery  PCCM   Procedures: Right temporal craniotomy for drainage of right temporal brain abscess done on 04/10/2018 by Dr. Tressie Stalker    Antimicrobials:  Anti-infectives (From admission, onward)   Start     Dose/Rate Route Frequency Ordered Stop   04/11/18 0800  cefTRIAXone (ROCEPHIN) 2 g in sodium chloride 0.9 % 100 mL IVPB  Status:  Discontinued     2 g 200 mL/hr over 30 Minutes Intravenous Every 24 hours 04/10/18 1257 04/10/18 1410   04/10/18 2200  cefTRIAXone (ROCEPHIN) 2 g in sodium chloride 0.9 % 100 mL IVPB     2 g 200 mL/hr over 30 Minutes Intravenous Every 12 hours 04/10/18 1410     04/10/18 1300  metroNIDAZOLE (FLAGYL) IVPB 500 mg     500 mg 100 mL/hr over 60 Minutes Intravenous Every 8 hours 04/10/18 1257     04/10/18 1123  bacitracin 50,000 Units in sodium chloride 0.9 % 500 mL irrigation  Status:  Discontinued       As needed 04/10/18 1154 04/10/18 1221   04/10/18 1000  vancomycin (VANCOCIN) 2,500 mg in sodium chloride 0.9 % 500 mL IVPB     2,500 mg 250 mL/hr over 120 Minutes Intravenous Every 12 hours 04/10/18 0114     04/09/18 2000  cefTRIAXone (ROCEPHIN) 2 g in sodium chloride 0.9 % 100 mL IVPB  Status:  Discontinued     2 g 200 mL/hr over 30 Minutes Intravenous Every 12 hours 04/09/18 1647 04/10/18 1350   04/09/18 1700  vancomycin (VANCOCIN) 2,500 mg  in sodium chloride 0.9 % 500 mL IVPB     2,500 mg 250 mL/hr over 120 Minutes Intravenous  Once 04/09/18 1647 04/09/18 2149   04/09/18 1700  metroNIDAZOLE (FLAGYL) IVPB 500 mg  Status:  Discontinued     500 mg 100 mL/hr over 60 Minutes Intravenous Every 8 hours 04/09/18 1647 04/10/18 1350     Subjective: Seen and examined at bedside and states that he is feeling "great".  Standing in front of the mirror and is practicing of boxing.  No nausea or vomiting.  States his headaches have improved.  Mother is asking if the seizure disorder may get better because of this.  No other concerns or complaints at this time.  Objective: Vitals:   04/10/18 1356 04/10/18 1500 04/10/18 1600 04/10/18 1700  BP: (!) 152/90 (!) 162/100 (!) 171/108 (!) 137/93  Pulse:  91 87 (!) 112  Resp:  18 18 18   Temp: 100 F (37.8 C)  99.3 F (37.4 C)   TempSrc: Axillary  Oral   SpO2:  98% 93% 94%  Weight: 134 kg  Height: 6\' 8"  (2.032 m)       Intake/Output Summary (Last 24 hours) at 04/10/2018 1741 Last data filed at 04/10/2018 1700 Gross per 24 hour  Intake 981.69 ml  Output 750 ml  Net 231.69 ml   Filed Weights   04/09/18 1723 04/10/18 1356  Weight: 132.5 kg 134 kg   Examination: Physical Exam:  Constitutional: WN/WD obes AAM in NAD and appears calm and comfortable Eyes: Lids and conjunctivae normal, sclerae anicteric  HENMT: External Ears, Nose appear normal. Grossly normal hearing. Mucous membranes are moist. Right side of the Head Bandaged. Neck: Appears normal, supple, no cervical masses, normal ROM, no appreciable thyromegaly; no JVD Respiratory: Clear to auscultation bilaterally, no wheezing, rales, rhonchi or crackles. Normal respiratory effort and patient is not tachypenic. No accessory muscle use.  Cardiovascular: RRR, no murmurs / rubs / gallops. S1 and S2 auscultated. No extremity edema.  Abdomen: Soft, non-tender, Distended slightly. No masses palpated. No appreciable hepatosplenomegaly.  Bowel sounds positive x4.  GU: Deferred. Musculoskeletal: No clubbing / cyanosis of digits/nails. No joint deformity upper and lower extremities.  Skin: No rashes, lesions, ulcers on a limited skin eval. No induration; Warm and dry. Wound on Right Temporal is covered but bandages appear C/D/I.  Neurologic: CN 2-12 grossly intact with no focal deficits. Romberg sign and cerebellar reflexes not assessed.  Psychiatric: Slightly impaired judgment and insight. Alert and oriented x 3. Normal mood and appropriate affect.   Data Reviewed: I have personally reviewed following labs and imaging studies  CBC: Recent Labs  Lab 04/09/18 1715 04/10/18 0916  WBC 16.2* 13.1*  NEUTROABS 13.6* 10.6*  HGB 15.0 15.2  HCT 45.8 44.3  MCV 84.5 84.1  PLT 364 381   Basic Metabolic Panel: Recent Labs  Lab 04/09/18 1715 04/10/18 0747 04/10/18 0916  NA 125* 136  --   K 4.1 3.4*  --   CL 89* 95*  --   CO2 23 28  --   GLUCOSE 512* 191*  --   BUN 9 5*  --   CREATININE 0.88 0.71  --   CALCIUM 9.4 9.4  --   MG  --   --  1.9  PHOS  --   --  3.5   GFR: Estimated Creatinine Clearance: 227.8 mL/min (by C-G formula based on SCr of 0.71 mg/dL). Liver Function Tests: Recent Labs  Lab 04/09/18 1715 04/10/18 0916  AST 11* 10*  ALT 10 10  ALKPHOS 80 60  BILITOT 0.8 0.8  PROT 7.1 6.9  ALBUMIN 3.8 3.3*   No results for input(s): LIPASE, AMYLASE in the last 168 hours. No results for input(s): AMMONIA in the last 168 hours. Coagulation Profile: No results for input(s): INR, PROTIME in the last 168 hours. Cardiac Enzymes: No results for input(s): CKTOTAL, CKMB, CKMBINDEX, TROPONINI in the last 168 hours. BNP (last 3 results) No results for input(s): PROBNP in the last 8760 hours. HbA1C: Recent Labs    04/10/18 0916  HGBA1C 14.9*   CBG: Recent Labs  Lab 04/10/18 0337 04/10/18 0740 04/10/18 0942 04/10/18 1245 04/10/18 1545  GLUCAP 256* 186* 176* 224* 248*   Lipid Profile: No results for  input(s): CHOL, HDL, LDLCALC, TRIG, CHOLHDL, LDLDIRECT in the last 72 hours. Thyroid Function Tests: No results for input(s): TSH, T4TOTAL, FREET4, T3FREE, THYROIDAB in the last 72 hours. Anemia Panel: No results for input(s): VITAMINB12, FOLATE, FERRITIN, TIBC, IRON, RETICCTPCT in the last 72 hours. Sepsis Labs: No results for input(s): PROCALCITON, LATICACIDVEN in the  last 168 hours.  Recent Results (from the past 240 hour(s))  Blood culture (routine x 2)     Status: None (Preliminary result)   Collection Time: 04/09/18  5:14 PM  Result Value Ref Range Status   Specimen Description BLOOD RIGHT ANTECUBITAL  Final   Special Requests   Final    BOTTLES DRAWN AEROBIC AND ANAEROBIC Blood Culture results may not be optimal due to an excessive volume of blood received in culture bottles   Culture   Final    NO GROWTH < 24 HOURS Performed at [facility], [address] [place], [place] [address]    Report Status PENDING  Incomplete  Blood culture (routine x 2)     Status: None (Preliminary result)   Collection Time: 04/09/18  5:14 PM  Result Value Ref Range Status   Specimen Description BLOOD RIGHT HAND  Final   Special Requests   Final    BOTTLES DRAWN AEROBIC AND ANAEROBIC Blood Culture adequate volume   Culture   Final    NO GROWTH < 24 HOURS Performed at [facility], [address] [place], [place] [address]    Report Status PENDING  Incomplete  Surgical pcr screen     Status: None   Collection Time: 04/10/18  9:00 AM  Result Value Ref Range Status   MRSA, PCR NEGATIVE NEGATIVE Final   Staphylococcus aureus NEGATIVE NEGATIVE Final    Comment: (NOTE) The Xpert SA Assay (FDA approved for NASAL specimens in patients 23 years of age and older), is one component of a comprehensive surveillance program. It is not intended to diagnose infection nor to guide or monitor treatment. Performed at [facility], [address] [place], [place] [address]       Scheduled Meds: . docusate sodium  100 mg Oral BID  . gabapentin  300 mg Oral TID  . HYDROmorphone      . [START ON 04/11/2018] Influenza vac split quadrivalent PF  0.5 mL Intramuscular Tomorrow-1000  . insulin aspart      . insulin aspart  0-20 Units Subcutaneous Q4H  . insulin glargine  35 Units Subcutaneous QHS  . metFORMIN  500 mg Oral BID WC  . pantoprazole (PROTONIX) IV  40 mg Intravenous QHS  . [START ON 04/11/2018] pneumococcal 23 valent vaccine  0.5 mL Intramuscular Tomorrow-1000   Continuous Infusions: . 0.9 % NaCl with KCl 20 mEq / L Stopped (04/10/18 1657)  . cefTRIAXone (ROCEPHIN)  IV    . metronidazole Stopped (04/10/18 1458)  . vancomycin      LOS: 0 days   Merlene Laughtermair Latif Sheikh, DO [facility] PAGER is on AMION  If 7PM-7AM, please contact night-coverage [email or web address] Password [email or web address] 04/10/2018, 5:41 PM

## 2018-04-10 NOTE — Progress Notes (Signed)
Subjective: The patient is alert and pleasant.  His mother is at the bedside.  Objective: Vital signs in last 24 hours: Temp:  [98.4 F (36.9 C)-99.2 F (37.3 C)] 98.9 F (37.2 C) (01/18 0808) Pulse Rate:  [92-116] 92 (01/18 0808) Resp:  [15-20] 15 (01/18 0808) BP: (118-185)/(64-118) 145/97 (01/18 0808) SpO2:  [94 %-99 %] 99 % (01/18 0808) Weight:  [132.5 kg] 132.5 kg (01/17 1723) Estimated body mass index is 32.08 kg/m as calculated from the following:   Height as of this encounter: 6\' 8"  (2.032 m).   Weight as of this encounter: 132.5 kg.   Intake/Output from previous day: No intake/output data recorded. Intake/Output this shift: No intake/output data recorded.  Physical exam patient is alert and pleasant.  He is moving all 4 extremities well.  Lab Results: Recent Labs    04/09/18 1715  WBC 16.2*  HGB 15.0  HCT 45.8  PLT 364   BMET Recent Labs    04/09/18 1715 04/10/18 0747  NA 125* 136  K 4.1 3.4*  CL 89* 95*  CO2 23 28  GLUCOSE 512* 191*  BUN 9 5*  CREATININE 0.88 0.71  CALCIUM 9.4 9.4    Studies/Results: Ct Head W & Wo Contrast  Result Date: 04/09/2018  [facility] [address] [place], [place] [address] [phone] NEUROIMAGING REPORT STUDY DATE: 04/08/18 PATIENT NAME: Mason Pratt DOB: 18-Nov-1995 MRN: [id number] ORDERING CLINICIAN: Dr Lucia Gaskins CLINICAL HISTORY: 22 year patient with headache COMPARISON FILMS: MRI Brain w/wo 03/18/2018 EXAM: CT Head w/wo TECHNIQUE: CT scan of the head was obtained utilizing 5 mm axial slices from the skull base to the vertex. CONTRAST:  75 ml iv omnipaque IMAGING SITE: [facility] FINDINGS: Deep brain parenchyma shows a well-circumscribed area of hypodensity in the right anterior temporal lobe which measures 2.2 mm in the AP, 2.2 in the transverse and 1.8 mm in the vertical dimension.  There is faint enhancement after contrast particularly in the posterior margin.  There is mild surrounding  cytotoxic edema, but no significant compression of the temporal horn or hydrocephalus.  Rest of the brain parenchyma appears unremarkable.  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  No other structural lesion tumor or infarcts are noted.  The ventricular system appear unremarkable.  Calvarium shows no abnormalities.  There does not appear to be significant bony involvement of the temporal  bone.   Abnormal CT scan of the brain with and without contrast showing circumscribed hypodensity in the right temporal lobe with faint enhancement likely compatible with subacute brain abscess.  Significant change compared with previous MRI dated 03/18/2018. INTERPRETING PHYSICIAN: Delia Heady, MD Certified in  Neuroimaging by American Society of Neuroimaging and [employer]    Assessment/Plan: Right temporal brain abscess: I have again discussed the situation with the patient and his mother.  I recommended craniotomy for evacuation of the abscess and obtaining cultures.  I have answered all the questions regarding surgery.  They want to proceed.  Hyponatremia: Resolved  Hyperglycemia: Much better  LOS: 0 days     Cristi Loron 04/10/2018, 10:00 AM

## 2018-04-10 NOTE — Op Note (Signed)
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  He had persistent pain.  He was worked up with a brain MRI on 03/18/2018 which demonstrated a facial infection but was intracranially normal.  He had persistent headaches and some seizures.  He had a repeat head CT performed 04/08/2018 which demonstrated a right temporal brain abscess.  I recommended surgery but because he was significantly hyperglycemic and hyponatremic the surgery had to be delayed.  I explained the surgery as well as the risks, benefits, alternatives, expected postoperative course, and likelihood of achieving our goals with surgery.  I have answered all the patient's, and his mother's, questions.  He has decided to proceed with surgery.  Preop diagnosis: Right temporal brain abscess  Postop diagnosis: The same  Procedure: Right temporal craniotomy for drainage of right temporal brain abscess  Surgeon: Dr. Delma OfficerJeff Jaquisha Frech  Assistant: Hildred PriestMegan Bergman nurse practitioner  Anesthesia: General tracheal  Estimated blood loss: Minimal  Specimens: Cultures  Drains: None  Complications: None  Description of procedure: The patient was brought to the operating room by the anesthesia team.  General endotracheal anesthesia was induced.  The patient remained in the supine position.  I applied the Mayfield three-point headrest to the patient's calvarium.  A roll was placed under his right shoulder.  His head was turned to the left exposing his right scalp.  His right scalp was shaved with clippers and prepared with Betadine scrub and Betadine solution.  Sterile drapes were applied.  I then injected the area to be incised with Marcaine with epinephrine solution.  I used the scalpel to make a right temporal incision.  We used the cerebellar retractor for exposure.  I used electrocautery to divide the temporalis fascia and muscle.  I exposed the underlying calvarium with  electrocautery and the periosteal elevator.  I then used a high-speed drill to create a inferior right temporal burr hole.  I used a footplate device to create a craniotomy flap.  I elevated the craniotomy flap with the Penfield #1.  I then incised the underlying dura with the Metzenbaum scissors.  I tacked back the dural edges.  I then used bipolar electrocautery to coagulate the cortical temporal surface.  I used the 15 blade to create a small corticotomy.  I then used bipolar cautery and suction to dissect deeper.  We quickly encountered pus.  We obtained cultures.  I then dissected deeper and entered into the abscess cavity.  We drained all the visible purulent material.  We then irrigated the wound out with bacitracin solution.  I obtained hemostasis using bipolar electrocautery.  We then reapproximated the patient's dura with interrupted 4-0 Nurolon suture.  We then replaced the craniotomy flap with titanium mini plates and screws.  We then removed the cerebellar retractor.  We reapproximated the temporalis fascia and muscle with interrupted 2-0 Vicryl suture.  We reapproximated the galea with interrupted 2-0 Vicryl suture.  We reapproximate the skin with stainless steel staples.  The wound was then coated with bacitracin ointment.  A sterile dressing was applied.  The drapes were removed.  I then removed the Mayfield three-point headrest from the patient's calvarium.  By report all sponge, instrument, and needle counts were correct at the end of this case.

## 2018-04-10 NOTE — Progress Notes (Signed)
Subjective: The patient is somnolent but easily arousable.  He is in no apparent distress.  Objective: Vital signs in last 24 hours: Temp:  [98.4 F (36.9 C)-99.2 F (37.3 C)] 98.9 F (37.2 C) (01/18 0808) Pulse Rate:  [92-116] 92 (01/18 0808) Resp:  [15-20] 15 (01/18 0808) BP: (118-185)/(64-118) 145/97 (01/18 0808) SpO2:  [94 %-99 %] 99 % (01/18 0808) Weight:  [132.5 kg] 132.5 kg (01/17 1723) Estimated body mass index is 32.08 kg/m as calculated from the following:   Height as of this encounter: 6\' 8"  (2.032 m).   Weight as of this encounter: 132.5 kg.   Intake/Output from previous day: No intake/output data recorded. Intake/Output this shift: No intake/output data recorded.  Physical exam the patient is somnolent but arousable.  He is moving all 4 extremities.  Lab Results: Recent Labs    04/09/18 1715 04/10/18 0916  WBC 16.2* 13.1*  HGB 15.0 15.2  HCT 45.8 44.3  PLT 364 381   BMET Recent Labs    04/09/18 1715 04/10/18 0747  NA 125* 136  K 4.1 3.4*  CL 89* 95*  CO2 23 28  GLUCOSE 512* 191*  BUN 9 5*  CREATININE 0.88 0.71  CALCIUM 9.4 9.4    Studies/Results: Ct Head W & Wo Contrast  Result Date: 04/09/2018  [facility] [address], [address] [place], [place] [address] [phone] NEUROIMAGING REPORT STUDY DATE: 04/08/18 PATIENT NAME: Mason Pratt DOB: 10-16-1995 MRN: [id number] ORDERING CLINICIAN: Dr Lucia Gaskins CLINICAL HISTORY: 22 year patient with headache COMPARISON FILMS: MRI Brain w/wo 03/18/2018 EXAM: CT Head w/wo TECHNIQUE: CT scan of the head was obtained utilizing 5 mm axial slices from the skull base to the vertex. CONTRAST:  75 ml iv omnipaque IMAGING SITE: [facility] FINDINGS: Deep brain parenchyma shows a well-circumscribed area of hypodensity in the right anterior temporal lobe which measures 2.2 mm in the AP, 2.2 in the transverse and 1.8 mm in the vertical dimension.  There is faint enhancement after contrast particularly in  the posterior margin.  There is mild surrounding cytotoxic edema, but no significant compression of the temporal horn or hydrocephalus.  Rest of the brain parenchyma appears unremarkable.  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  No other structural lesion tumor or infarcts are noted.  The ventricular system appear unremarkable.  Calvarium shows no abnormalities.  There does not appear to be significant bony involvement of the temporal  bone.   Abnormal CT scan of the brain with and without contrast showing circumscribed hypodensity in the right temporal lobe with faint enhancement likely compatible with subacute brain abscess.  Significant change compared with previous MRI dated 03/18/2018. INTERPRETING PHYSICIAN: Delia Heady, MD Certified in  Neuroimaging by American Society of Neuroimaging and [employer]    Assessment/Plan: The patient is doing well.  I will ask ID to see the patient to help US guide the antibiotics.  LOS: 0 days     Cristi Loron 04/10/2018, 12:24 PM

## 2018-04-11 DIAGNOSIS — Z98818 Other dental procedure status: Secondary | ICD-10-CM

## 2018-04-11 DIAGNOSIS — F909 Attention-deficit hyperactivity disorder, unspecified type: Secondary | ICD-10-CM

## 2018-04-11 DIAGNOSIS — I10 Essential (primary) hypertension: Secondary | ICD-10-CM

## 2018-04-11 DIAGNOSIS — Z8669 Personal history of other diseases of the nervous system and sense organs: Secondary | ICD-10-CM

## 2018-04-11 DIAGNOSIS — K047 Periapical abscess without sinus: Secondary | ICD-10-CM

## 2018-04-11 LAB — COMPREHENSIVE METABOLIC PANEL
ALT: 10 U/L (ref 0–44)
AST: 13 U/L — ABNORMAL LOW (ref 15–41)
Albumin: 3.5 g/dL (ref 3.5–5.0)
Alkaline Phosphatase: 68 U/L (ref 38–126)
Anion gap: 12 (ref 5–15)
BUN: 8 mg/dL (ref 6–20)
CO2: 25 mmol/L (ref 22–32)
Calcium: 9.1 mg/dL (ref 8.9–10.3)
Chloride: 96 mmol/L — ABNORMAL LOW (ref 98–111)
Creatinine, Ser: 1.04 mg/dL (ref 0.61–1.24)
GFR calc non Af Amer: >60 mL/min (ref >60)
Glucose, Bld: 249 mg/dL — ABNORMAL HIGH (ref 70–99)
Potassium: 3.7 mmol/L (ref 3.5–5.1)
Sodium: 133 mmol/L — ABNORMAL LOW (ref 135–145)
Total Bilirubin: 0.7 mg/dL (ref 0.3–1.2)
Total Protein: 7.1 g/dL (ref 6.5–8.1)

## 2018-04-11 LAB — GLUCOSE, CAPILLARY
GLUCOSE-CAPILLARY: 374 mg/dL — AB (ref 70–99)
Glucose-Capillary: 144 mg/dL — ABNORMAL HIGH (ref 70–99)
Glucose-Capillary: 176 mg/dL — ABNORMAL HIGH (ref 70–99)
Glucose-Capillary: 217 mg/dL — ABNORMAL HIGH (ref 70–99)
Glucose-Capillary: 277 mg/dL — ABNORMAL HIGH (ref 70–99)
Glucose-Capillary: 368 mg/dL — ABNORMAL HIGH (ref 70–99)

## 2018-04-11 LAB — CBC WITH DIFFERENTIAL/PLATELET
Abs Immature Granulocytes: 0.07 10*3/uL (ref 0.00–0.07)
Basophils Absolute: 0 10*3/uL (ref 0.0–0.1)
Basophils Relative: 0 %
EOS PCT: 0 %
Eosinophils Absolute: 0 10*3/uL (ref 0.0–0.5)
HCT: 46.6 % (ref 39.0–52.0)
HEMOGLOBIN: 15.4 g/dL (ref 13.0–17.0)
Immature Granulocytes: 0 %
Lymphocytes Relative: 9 %
Lymphs Abs: 1.6 10*3/uL (ref 0.7–4.0)
MCH: 28.5 pg (ref 26.0–34.0)
MCHC: 33 g/dL (ref 30.0–36.0)
MCV: 86.1 fL (ref 80.0–100.0)
Monocytes Absolute: 1.2 10*3/uL — ABNORMAL HIGH (ref 0.1–1.0)
Monocytes Relative: 7 %
Neutro Abs: 15.1 10*3/uL — ABNORMAL HIGH (ref 1.7–7.7)
Neutrophils Relative %: 84 %
Platelets: 350 10*3/uL (ref 150–400)
RBC: 5.41 MIL/uL (ref 4.22–5.81)
RDW: 13.9 % (ref 11.5–15.5)
WBC: 18 10*3/uL — ABNORMAL HIGH (ref 4.0–10.5)
nRBC: 0 % (ref 0.0–0.2)

## 2018-04-11 LAB — PHOSPHORUS: Phosphorus: 4.6 mg/dL (ref 2.5–4.6)

## 2018-04-11 LAB — MAGNESIUM: MAGNESIUM: 1.9 mg/dL (ref 1.7–2.4)

## 2018-04-11 MED ORDER — LABETALOL HCL 5 MG/ML IV SOLN: 10.0000 mg | INTRAVENOUS | Status: DC | PRN

## 2018-04-11 MED ORDER — VANCOMYCIN HCL 10 G IV SOLR
1250.0000 mg | Freq: Three times a day (TID) | INTRAVENOUS | Status: DC
  Administered 2018-04-12 (×2): 1250 mg via INTRAVENOUS
  Filled 2018-04-11 (×4): qty 1250

## 2018-04-11 MED ORDER — HYDRALAZINE HCL 20 MG/ML IJ SOLN
10.0000 mg | INTRAMUSCULAR | Status: DC | PRN
  Administered 2018-04-12: 10 mg via INTRAVENOUS
  Filled 2018-04-11 (×3): qty 1

## 2018-04-11 MED ORDER — DIVALPROEX SODIUM ER 500 MG PO TB24
1000.0000 mg | ORAL_TABLET | Freq: Every day | ORAL | Status: DC
  Administered 2018-04-11 – 2018-04-12 (×2): 1000 mg via ORAL
  Administered 2018-04-13: 500 mg via ORAL
  Administered 2018-04-14: 1000 mg via ORAL
  Filled 2018-04-11 (×5): qty 2

## 2018-04-11 NOTE — Progress Notes (Signed)
P.t. refuses to allow staff to set bed alarm. P.t was educated on safety concerns and still refused.

## 2018-04-11 NOTE — Consult Note (Signed)
Date of Admission:  04/09/2018          Reason for Consult: Temporal lobe brain abscess due to dental infection    Referring Provider: Dr. Lovell SheehanJenkins   Assessment:  1. Brain abscess due to dental infection  2. History of wisdom teeth extraction 3. History of a seizure as a child but no seizures until he developed his current dental and as it turns out CNS infection  Plan:  1. Continue vancomycin and ceftriaxone and metronidazole for now 2. Follow-up intraoperative cultures 3. He will need a PICC line 4. He will need 6 if not 8 weeks of IV antibiotics with repeat imaging to reassure us that this brain abscess has resolved  Dr. Luciana Axeomer to take over the service tomorrow.  Principal Problem:   Brain abscess Active Problems:   Seizures (HCC)   DM (diabetes mellitus), type 2 (HCC)   Dental abscess   Essential hypertension   pseudohyponatremia   Brain mass   Scheduled Meds: . docusate sodium  100 mg Oral BID  . gabapentin  300 mg Oral TID  . Influenza vac split quadrivalent PF  0.5 mL Intramuscular Tomorrow-1000  . insulin aspart  0-20 Units Subcutaneous Q4H  . insulin glargine  35 Units Subcutaneous QHS  . metFORMIN  500 mg Oral BID WC  . pantoprazole (PROTONIX) IV  40 mg Intravenous QHS  . pneumococcal 23 valent vaccine  0.5 mL Intramuscular Tomorrow-1000  . potassium chloride  40 mEq Oral BID   Continuous Infusions: . 0.9 % NaCl with KCl 20 mEq / L 75 mL/hr at 04/11/18 0800  . cefTRIAXone (ROCEPHIN)  IV 2 g (04/10/18 2009)  . metronidazole Stopped (04/11/18 0545)  . vancomycin Stopped (04/11/18 0017)   PRN Meds:.acetaminophen **OR** acetaminophen, bisacodyl, hydrALAZINE, HYDROcodone-acetaminophen, labetalol, morphine injection, ondansetron **OR** ondansetron (ZOFRAN) IV, promethazine, sodium phosphate  HPI: Mason BuffReece A Pratt is a 23 y.o. male history of a seizure as a child but not a seizure disorder since I understand the history correctly who also does have problems  with hypertension and ADHD who developed pain in his face associated with a dental infection along with severe pain in his head.  According to the mother who provided a great deal of the history partly due to the fact that she is very active in his care he was going [facility] and forth between [facility] emergency department and a dentist with regards to the fact that he had swelling and pain in his right face along with what appears to have been a significant dental infection.  He was treated with various antibiotics including clindamycin and amoxicillin.  Ultimately in December he underwent extraction of his wisdom teeth by Dr. Valentino HueKalinsky in the hospital.  He was on amoxicillin and then admitted on December 26 for new onset seizures.  Note patient had already began to complain of a fairly painful episodic headache on the right side of his skull which he said felt like someone was "driving a spike into my brain"  MRI of the brain did not show any evidence of infection at that time but did show Confluent edema and abnormal enhancement in the medial right masticator space with small areas of pterygoid intramuscular necrosis or possibly abscess (series 12, image 4).  He continued to be treated with various antibiotics and was on clindamycin when he had a CT scan of the brain performed at the direction of neurology.  This showed a brain abscess in the anterior portion  of the right temporal lobe.  He was taken to the operating room yesterday by Dr. Lovell Sheehan underwent craniotomy with purulent material sent for culture.  Note he was previously already on IV antibiotics in the form of vancomycin and ceftriaxone and metronidazole.  Suspect we are not going to grow any organism given the antecedent antibiotics that he was given.  However we will follow-up cultures and in the meantime continue his current antimicrobials.     Review of Systems: Review of Systems  Constitutional: Negative for chills,  diaphoresis, fever, malaise/fatigue and weight loss.  HENT: Negative for congestion, hearing loss, sore throat and tinnitus.   Eyes: Negative for blurred vision and double vision.  Respiratory: Negative for cough, sputum production, shortness of breath and wheezing.   Cardiovascular: Negative for chest pain, palpitations and leg swelling.  Gastrointestinal: Positive for nausea. Negative for abdominal pain, blood in stool, constipation, diarrhea, heartburn, melena and vomiting.  Genitourinary: Negative for dysuria, flank pain and hematuria.  Musculoskeletal: Positive for myalgias and neck pain. Negative for back pain, falls and joint pain.  Skin: Negative for itching and rash.  Neurological: Positive for headaches. Negative for dizziness, sensory change, focal weakness, loss of consciousness and weakness.  Endo/Heme/Allergies: Does not bruise/bleed easily.  Psychiatric/Behavioral: Negative for depression, memory loss and suicidal ideas. The patient is nervous/anxious.     Past Medical History:  Diagnosis Date  . ADD (attention deficit disorder)   . ADHD (attention deficit hyperactivity disorder)   . Arthritis    "knees" (03/18/2018"  . Cheek mass    "noted when he had wisdom teeth taken out 03/02/2018" (03/18/2018)  . Childhood asthma   . Hyperlipidemia   . Hypertension   . Mental disorder    "EMD:  emotional mental disorder; dx'd at age 59 months" (03/18/2018)  . Seizure [facility]) 1997; 03/18/2018   "was told that indicated that he would be a special needs child;"  . Type II diabetes mellitus (HCC)     Social History   Tobacco Use  . Smoking status: Never Smoker  . Smokeless tobacco: Never Used  Substance Use Topics  . Alcohol use: No  . Drug use: Never    Family History  Problem Relation Age of Onset  . Headache Mother   . Seizures Brother   . Headache Brother   . Alzheimer's disease Other        mat great grandmother, maternal great uncle, great aunts on mother's side     . Fibromyalgia Other        mother's side of the family    No Known Allergies  OBJECTIVE: Blood pressure 132/90, pulse (!) 128, temperature 98.9 F (37.2 C), temperature source Oral, resp. rate 18, height 6\' 8"  (2.032 m), weight 134 kg, SpO2 95 %.  Physical Exam Constitutional:      General: He is not in acute distress.    Appearance: He is well-developed. He is not ill-appearing or diaphoretic.  HENT:     Right Ear: Hearing and external ear normal.     Left Ear: Hearing and external ear normal.     Nose: No nasal deformity or rhinorrhea.  Eyes:     General: No scleral icterus.    Conjunctiva/sclera: Conjunctivae normal.     Right eye: Right conjunctiva is not injected.     Left eye: Left conjunctiva is not injected.  Neck:     Musculoskeletal: Normal range of motion and neck supple.     Vascular: No JVD.  Cardiovascular:  Rate and Rhythm: Normal rate and regular rhythm.     Heart sounds: S1 normal and S2 normal.  Pulmonary:     Effort: Pulmonary effort is normal. No respiratory distress.  Abdominal:     General: Bowel sounds are normal. There is no distension.     Palpations: Abdomen is soft.  Musculoskeletal: Normal range of motion.     Right shoulder: Normal.     Left shoulder: Normal.     Right hip: Normal.     Left hip: Normal.     Right knee: Normal.     Left knee: Normal.  Lymphadenopathy:     Head:     Right side of head: No submandibular, preauricular or posterior auricular adenopathy.     Left side of head: No submandibular, preauricular or posterior auricular adenopathy.     Cervical: No cervical adenopathy.     Right cervical: No superficial or deep cervical adenopathy.    Left cervical: No superficial or deep cervical adenopathy.  Skin:    General: Skin is warm and dry.     Coloration: Skin is not pale.     Findings: No abrasion, bruising, ecchymosis, erythema, lesion or rash.     Nails: There is no clubbing.   Neurological:     Mental Status:  He is alert and oriented to person, place, and time.     Sensory: No sensory deficit.     Coordination: Coordination normal.     Gait: Gait normal.  Psychiatric:        Attention and Perception: He is attentive.        Speech: Speech normal.        Behavior: Behavior normal. Behavior is cooperative.        Thought Content: Thought content normal.        Judgment: Judgment normal.    Bandage over his scalp  Lab Results Lab Results  Component Value Date   WBC 18.0 (H) 04/11/2018   HGB 15.4 04/11/2018   HCT 46.6 04/11/2018   MCV 86.1 04/11/2018   PLT 350 04/11/2018    Lab Results  Component Value Date   CREATININE 1.04 04/11/2018   BUN 8 04/11/2018   NA 133 (L) 04/11/2018   K 3.7 04/11/2018   CL 96 (L) 04/11/2018   CO2 25 04/11/2018    Lab Results  Component Value Date   ALT 10 04/11/2018   AST 13 (L) 04/11/2018   ALKPHOS 68 04/11/2018   BILITOT 0.7 04/11/2018     Microbiology: Recent Results (from the past 240 hour(s))  Blood culture (routine x 2)     Status: None (Preliminary result)   Collection Time: 04/09/18  5:14 PM  Result Value Ref Range Status   Specimen Description BLOOD RIGHT ANTECUBITAL  Final   Special Requests   Final    BOTTLES DRAWN AEROBIC AND ANAEROBIC Blood Culture results may not be optimal due to an excessive volume of blood received in culture bottles   Culture   Final    NO GROWTH < 24 HOURS Performed at [facility], [address] [place], [place] [address]    Report Status PENDING  Incomplete  Blood culture (routine x 2)     Status: None (Preliminary result)   Collection Time: 04/09/18  5:14 PM  Result Value Ref Range Status   Specimen Description BLOOD RIGHT HAND  Final   Special Requests   Final    BOTTLES DRAWN AEROBIC AND ANAEROBIC Blood Culture  adequate volume   Culture   Final    NO GROWTH < 24 HOURS Performed at [facility], [address] [place], [place] [address]    Report Status PENDING  Incomplete   Surgical pcr screen     Status: None   Collection Time: 04/10/18  9:00 AM  Result Value Ref Range Status   MRSA, PCR NEGATIVE NEGATIVE Final   Staphylococcus aureus NEGATIVE NEGATIVE Final    Comment: (NOTE) The Xpert SA Assay (FDA approved for NASAL specimens in patients 23 years of age and older), is one component of a comprehensive surveillance program. It is not intended to diagnose infection nor to guide or monitor treatment. Performed at [facility], [address] [place], [place] [address]   Aerobic/Anaerobic Culture (surgical/deep wound)     Status: None (Preliminary result)   Collection Time: 04/10/18 11:42 AM  Result Value Ref Range Status   Specimen Description ABSCESS  Final   Special Requests RIGHT BRAIN  Final   Gram Stain   Final    RARE WBC PRESENT, PREDOMINANTLY MONONUCLEAR NO ORGANISMS SEEN Performed at [facility], [address] [place], [place] [address]    Culture PENDING  Incomplete   Report Status PENDING  Incomplete  MRSA PCR Screening     Status: None   Collection Time: 04/10/18  2:01 PM  Result Value Ref Range Status   MRSA by PCR NEGATIVE NEGATIVE Final    Comment:        The GeneXpert MRSA Assay (FDA approved for NASAL specimens only), is one component of a comprehensive MRSA colonization surveillance program. It is not intended to diagnose MRSA infection nor to guide or monitor treatment for MRSA infections. Performed at [facility], [address] [place], [place] [address]     Acey Lavornelius Van Dam, MD [facility] [phone] pager  04/11/2018, 10:27 AM

## 2018-04-11 NOTE — Progress Notes (Signed)
Pharmacy Antibiotic Note  Mason Pratt is a 23 y.o. male admitted on 04/09/2018 with brain abscess s/p recent dental extraction.  Pharmacy has been consulted for Vancomycin dosing.   SCr up slightly today at 1.04. nCrCl~113 mL/min.  WBC is rising at 18. Tm 100. ID is on board.  Patient s/p crainotomy on 1/18 with drainage of purulent fluid sent for analysis (note culture is after antibiotics initiated).   Not using AUC dosing in setting of brain abscess. Will adjust dosing accordingly.   Plan: Change Vancomycin to 1250mg  IV every 8 hours.  Continue Ceftriaxone 2g IV q12h Continue Flagyl 500 mg IV q8h Trend WBC, temp, renal function  F/U infectious work-up Drug levels as indicated   Height: 6\' 8"  (203.2 cm) Weight: 295 lb 6.7 oz (134 kg) IBW/kg (Calculated) : 96  Temp (24hrs), Avg:99.2 F (37.3 C), Min:98 F (36.7 C), Max:100 F (37.8 C)  Recent Labs  Lab 04/09/18 1715 04/10/18 0747 04/10/18 0916 04/11/18 0647  WBC 16.2*  --  13.1* 18.0*  CREATININE 0.88 0.71  --  1.04    Estimated Creatinine Clearance: 175.2 mL/min (by C-G formula based on SCr of 1.04 mg/dL).    No Known Allergies   Link Snuffer, PharmD, BCPS, BCCCP Clinical Pharmacist Please refer to [facility] for [facility] Pharmacy numbers 04/11/2018 1:44 PM

## 2018-04-11 NOTE — Progress Notes (Signed)
Subjective: The patient is alert and pleasant.  He has no complaints except that he is hungry.  His mother is at the bedside.  Objective: Vital signs in last 24 hours: Temp:  [97.7 F (36.5 C)-100 F (37.8 C)] 99.4 F (37.4 C) (01/19 0400) Pulse Rate:  [87-121] 103 (01/19 0700) Resp:  [14-22] 22 (01/19 0400) BP: (120-172)/(74-121) 163/108 (01/19 0700) SpO2:  [90 %-100 %] 97 % (01/19 0700) Weight:  [134 kg] 134 kg (01/18 1356) Estimated body mass index is 32.45 kg/m as calculated from the following:   Height as of this encounter: 6\' 8"  (2.032 m).   Weight as of this encounter: 134 kg.   Intake/Output from previous day: 01/18 0701 - 01/19 0700 In: 3249.4 [I.V.:1611.8; IV Piggyback:1637.6] Out: 750 [Urine:700; Blood:50] Intake/Output this shift: No intake/output data recorded.  Physical exam the patient is alert and oriented x3.  His strength is normal.  His pupils are equal.  His dressing is clean and dry.  Lab Results: Recent Labs    04/10/18 0916 04/11/18 0647  WBC 13.1* 18.0*  HGB 15.2 15.4  HCT 44.3 46.6  PLT 381 350   BMET Recent Labs    04/09/18 1715 04/10/18 0747  NA 125* 136  K 4.1 3.4*  CL 89* 95*  CO2 23 28  GLUCOSE 512* 191*  BUN 9 5*  CREATININE 0.88 0.71  CALCIUM 9.4 9.4    Studies/Results: No results found.  Assessment/Plan: Postop day 1: So far the cultures are negative.  We will continue with empiric antibiotics for now.  ID has been contacted and is going to see the patient today.  I appreciate IDs input.  I have answered all the patient's, and his mother's questions.  I will transfer him to the floor.  LOS: 1 day     Cristi LoronJeffrey D Leighton Luster 04/11/2018, 8:05 AM

## 2018-04-12 ENCOUNTER — Inpatient Hospital Stay: Payer: Self-pay

## 2018-04-12 ENCOUNTER — Encounter (HOSPITAL_COMMUNITY): Payer: Self-pay | Admitting: Neurosurgery

## 2018-04-12 DIAGNOSIS — E119 Type 2 diabetes mellitus without complications: Secondary | ICD-10-CM

## 2018-04-12 DIAGNOSIS — Z794 Long term (current) use of insulin: Secondary | ICD-10-CM

## 2018-04-12 DIAGNOSIS — E118 Type 2 diabetes mellitus with unspecified complications: Secondary | ICD-10-CM

## 2018-04-12 DIAGNOSIS — R569 Unspecified convulsions: Secondary | ICD-10-CM

## 2018-04-12 DIAGNOSIS — E870 Hyperosmolality and hypernatremia: Secondary | ICD-10-CM

## 2018-04-12 LAB — GLUCOSE, CAPILLARY
GLUCOSE-CAPILLARY: 381 mg/dL — AB (ref 70–99)
Glucose-Capillary: 135 mg/dL — ABNORMAL HIGH (ref 70–99)
Glucose-Capillary: 268 mg/dL — ABNORMAL HIGH (ref 70–99)
Glucose-Capillary: 350 mg/dL — ABNORMAL HIGH (ref 70–99)
Glucose-Capillary: 304 mg/dL — ABNORMAL HIGH (ref 70–99)

## 2018-04-12 LAB — COMPREHENSIVE METABOLIC PANEL
ALT: 9 U/L (ref 0–44)
ANION GAP: 11 (ref 5–15)
AST: 10 U/L — ABNORMAL LOW (ref 15–41)
Albumin: 3 g/dL — ABNORMAL LOW (ref 3.5–5.0)
Alkaline Phosphatase: 58 U/L (ref 38–126)
BUN: 11 mg/dL (ref 6–20)
CO2: 28 mmol/L (ref 22–32)
Calcium: 8.9 mg/dL (ref 8.9–10.3)
Chloride: 95 mmol/L — ABNORMAL LOW (ref 98–111)
Creatinine, Ser: 0.98 mg/dL (ref 0.61–1.24)
GFR calc Af Amer: >60 mL/min (ref >60)
GFR calc non Af Amer: >60 mL/min (ref >60)
Glucose, Bld: 284 mg/dL — ABNORMAL HIGH (ref 70–99)
POTASSIUM: 4.2 mmol/L (ref 3.5–5.1)
Sodium: 134 mmol/L — ABNORMAL LOW (ref 135–145)
Total Bilirubin: 0.6 mg/dL (ref 0.3–1.2)
Total Protein: 6.4 g/dL — ABNORMAL LOW (ref 6.5–8.1)

## 2018-04-12 LAB — CBC WITH DIFFERENTIAL/PLATELET
Abs Immature Granulocytes: 0.05 10*3/uL (ref 0.00–0.07)
Basophils Absolute: 0 10*3/uL (ref 0.0–0.1)
Basophils Relative: 0 %
Eosinophils Absolute: 0.1 10*3/uL (ref 0.0–0.5)
Eosinophils Relative: 1 %
HCT: 41.5 % (ref 39.0–52.0)
Hemoglobin: 13.8 g/dL (ref 13.0–17.0)
Immature Granulocytes: 0 %
Lymphocytes Relative: 16 %
Lymphs Abs: 2 10*3/uL (ref 0.7–4.0)
MCH: 29.2 pg (ref 26.0–34.0)
MCHC: 33.3 g/dL (ref 30.0–36.0)
MCV: 87.9 fL (ref 80.0–100.0)
Monocytes Absolute: 0.9 10*3/uL (ref 0.1–1.0)
Monocytes Relative: 7 %
NRBC: 0 % (ref 0.0–0.2)
Neutro Abs: 9.7 10*3/uL — ABNORMAL HIGH (ref 1.7–7.7)
Neutrophils Relative %: 76 %
Platelets: 302 10*3/uL (ref 150–400)
RBC: 4.72 MIL/uL (ref 4.22–5.81)
RDW: 14 % (ref 11.5–15.5)
WBC: 12.7 10*3/uL — ABNORMAL HIGH (ref 4.0–10.5)

## 2018-04-12 LAB — MAGNESIUM: Magnesium: 2 mg/dL (ref 1.7–2.4)

## 2018-04-12 LAB — PHOSPHORUS: PHOSPHORUS: 3.2 mg/dL (ref 2.5–4.6)

## 2018-04-12 MED ORDER — INSULIN ASPART 100 UNIT/ML ~~LOC~~ SOLN
0.0000 [IU] | Freq: Three times a day (TID) | SUBCUTANEOUS | Status: DC
  Administered 2018-04-12: 15 [IU] via SUBCUTANEOUS
  Administered 2018-04-12: 11 [IU] via SUBCUTANEOUS
  Administered 2018-04-12 – 2018-04-13 (×2): 3 [IU] via SUBCUTANEOUS
  Administered 2018-04-13 (×2): 15 [IU] via SUBCUTANEOUS
  Administered 2018-04-14 (×2): 11 [IU] via SUBCUTANEOUS

## 2018-04-12 MED ORDER — INSULIN ASPART 100 UNIT/ML ~~LOC~~ SOLN
8.0000 [IU] | Freq: Three times a day (TID) | SUBCUTANEOUS | Status: DC
  Administered 2018-04-12 – 2018-04-14 (×6): 8 [IU] via SUBCUTANEOUS

## 2018-04-12 MED ORDER — GLUCERNA SHAKE PO LIQD
237.0000 mL | Freq: Two times a day (BID) | ORAL | Status: DC
  Administered 2018-04-12 – 2018-04-14 (×4): 237 mL via ORAL
  Filled 2018-04-12: qty 237

## 2018-04-12 MED ORDER — INSULIN GLARGINE 100 UNIT/ML ~~LOC~~ SOLN
40.0000 [IU] | Freq: Every day | SUBCUTANEOUS | Status: DC
  Administered 2018-04-12: 40 [IU] via SUBCUTANEOUS
  Filled 2018-04-12 (×2): qty 0.4

## 2018-04-12 NOTE — Progress Notes (Signed)
PROGRESS NOTE    Mason Pratt   OPF:[id number]  DOB: 1995-09-07  DOA: 04/09/2018 PCP: System, Provider Not In   Brief Narrative:  Mason Pratt  is a 23 y.o. male with medical history significant of recently onset of seizures (last month), uncontrolled DM, developmental delay,  with recent extraction of his teeth who was on antibiotics comes in with a CT scan which shows a brain abscess . The CT was ordered by neurology as an outpatient for seizures.  Noted to be hyperglycemic with CBG in 500s in the ED. [facility] asked to admit.   Recently admitted from 03/18/18-03/19/18 and placed on Depakote after a neurology eval. And head CT and MRI of the brain did not show an abscess at that time.  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 ID was consulted to assist with management an evaluated the patient on 04/11/18.    Subjective: No complaints.     Assessment & Plan:   Principal Problem:   Brain abscess- suspected to have started as a dental abscess - appreciate ID eval- cont antibiotics per ID- f/u on culture of the abscess  Active Problems:   Seizures - cont Depakote    DM (diabetes mellitus), type 2 - uncontrolled  With pseudohyponatermia - on 30 U of Lantus and Glucophage at home along with Novolog on a sliding scale - currently on 35 U of lantus and resistant SSI- will increase doses of both as sugars still high     Essential hypertension - PRN Hydralazine and Labetalol ordered   Obesity   Body mass index is 32.45 kg/m.  Developmental delay   Time spent in minutes: 35 DVT prophylaxis: SCDs Code Status: Full code Family Communication:  Disposition Plan: f/u culture results Consultants:   NS  ID  PCCM Procedures:   Procedures: Right temporal craniotomy for drainage of right temporal brain abscess done on 04/10/2018 by Dr. Tressie Stalker   Antimicrobials:  Anti-infectives (From admission, onward)   Start     Dose/Rate  Route Frequency Ordered Stop   04/12/18 0000  vancomycin (VANCOCIN) 1,250 mg in sodium chloride 0.9 % 250 mL IVPB  Status:  Discontinued     1,250 mg 166.7 mL/hr over 90 Minutes Intravenous Every 8 hours 04/11/18 1348 04/12/18 1447   04/11/18 0800  cefTRIAXone (ROCEPHIN) 2 g in sodium chloride 0.9 % 100 mL IVPB  Status:  Discontinued     2 g 200 mL/hr over 30 Minutes Intravenous Every 24 hours 04/10/18 1257 04/10/18 1410   04/10/18 2200  cefTRIAXone (ROCEPHIN) 2 g in sodium chloride 0.9 % 100 mL IVPB     2 g 200 mL/hr over 30 Minutes Intravenous Every 12 hours 04/10/18 1410     04/10/18 1300  metroNIDAZOLE (FLAGYL) IVPB 500 mg     500 mg 100 mL/hr over 60 Minutes Intravenous Every 8 hours 04/10/18 1257     04/10/18 1123  bacitracin 50,000 Units in sodium chloride 0.9 % 500 mL irrigation  Status:  Discontinued       As needed 04/10/18 1154 04/10/18 1221   04/10/18 1000  vancomycin (VANCOCIN) 2,500 mg in sodium chloride 0.9 % 500 mL IVPB  Status:  Discontinued     2,500 mg 250 mL/hr over 120 Minutes Intravenous Every 12 hours 04/10/18 0114 04/11/18 1348   04/09/18 2000  cefTRIAXone (ROCEPHIN) 2 g in sodium chloride 0.9 % 100 mL IVPB  Status:  Discontinued     2  g 200 mL/hr over 30 Minutes Intravenous Every 12 hours 04/09/18 1647 04/10/18 1350   04/09/18 1700  vancomycin (VANCOCIN) 2,500 mg in sodium chloride 0.9 % 500 mL IVPB     2,500 mg 250 mL/hr over 120 Minutes Intravenous  Once 04/09/18 1647 04/09/18 2149   04/09/18 1700  metroNIDAZOLE (FLAGYL) IVPB 500 mg  Status:  Discontinued     500 mg 100 mL/hr over 60 Minutes Intravenous Every 8 hours 04/09/18 1647 04/10/18 1350       Objective: Vitals:   04/12/18 0057 04/12/18 0400 04/12/18 0800 04/12/18 1200  BP: (!) 150/108 (!) 129/95 (!) 162/114 (!) 150/76  Pulse: 99 (!) 110 99 (!) 110  Resp: 18 16  18   Temp: 98.9 F (37.2 C) 98.2 F (36.8 C) 98.3 F (36.8 C) 98.5 F (36.9 C)  TempSrc: Oral Oral Oral Oral  SpO2: 99% 97% 98% 97%   Weight:      Height:        Intake/Output Summary (Last 24 hours) at 04/12/2018 1558 Last data filed at 04/12/2018 0630 Gross per 24 hour  Intake 2073.61 ml  Output 2400 ml  Net -326.39 ml   Filed Weights   04/09/18 1723 04/10/18 1356  Weight: 132.5 kg 134 kg    Examination: General exam: Appears comfortable  HEENT: PERRLA, oral mucosa moist, no sclera icterus or thrush Respiratory system: Clear to auscultation. Respiratory effort normal. Cardiovascular system: S1 & S2 heard, RRR.   Gastrointestinal system: Abdomen soft, non-tender, nondistended. Normal bowel sounds. Central nervous system: Alert and oriented. No focal neurological deficits. Extremities: No cyanosis, clubbing or edema Psychiatry:  Mood & affect appropriate.     Data Reviewed: I have personally reviewed following labs and imaging studies  CBC: Recent Labs  Lab 04/09/18 1715 04/10/18 0916 04/11/18 0647 04/12/18 0541  WBC 16.2* 13.1* 18.0* 12.7*  NEUTROABS 13.6* 10.6* 15.1* 9.7*  HGB 15.0 15.2 15.4 13.8  HCT 45.8 44.3 46.6 41.5  MCV 84.5 84.1 86.1 87.9  PLT 364 381 350 302   Basic Metabolic Panel: Recent Labs  Lab 04/09/18 1715 04/10/18 0747 04/10/18 0916 04/11/18 0647 04/12/18 0541  NA 125* 136  --  133* 134*  K 4.1 3.4*  --  3.7 4.2  CL 89* 95*  --  96* 95*  CO2 23 28  --  25 28  GLUCOSE 512* 191*  --  249* 284*  BUN 9 5*  --  8 11  CREATININE 0.88 0.71  --  1.04 0.98  CALCIUM 9.4 9.4  --  9.1 8.9  MG  --   --  1.9 1.9 2.0  PHOS  --   --  3.5 4.6 3.2   GFR: Estimated Creatinine Clearance: 186 mL/min (by C-G formula based on SCr of 0.98 mg/dL). Liver Function Tests: Recent Labs  Lab 04/09/18 1715 04/10/18 0916 04/11/18 0647 04/12/18 0541  AST 11* 10* 13* 10*  ALT 10 10 10 9   ALKPHOS 80 60 68 58  BILITOT 0.8 0.8 0.7 0.6  PROT 7.1 6.9 7.1 6.4*  ALBUMIN 3.8 3.3* 3.5 3.0*   No results for input(s): LIPASE, AMYLASE in the last 168 hours. No results for input(s): AMMONIA in the  last 168 hours. Coagulation Profile: No results for input(s): INR, PROTIME in the last 168 hours. Cardiac Enzymes: No results for input(s): CKTOTAL, CKMB, CKMBINDEX, TROPONINI in the last 168 hours. BNP (last 3 results) No results for input(s): PROBNP in the last 8760 hours. HbA1C: Recent Labs  04/10/18 0916  HGBA1C 14.9*   CBG: Recent Labs  Lab 04/11/18 2015 04/11/18 2354 04/12/18 0416 04/12/18 0735 04/12/18 1135  GLUCAP 374* 277* 381* 135* 268*   Lipid Profile: No results for input(s): CHOL, HDL, LDLCALC, TRIG, CHOLHDL, LDLDIRECT in the last 72 hours. Thyroid Function Tests: No results for input(s): TSH, T4TOTAL, FREET4, T3FREE, THYROIDAB in the last 72 hours. Anemia Panel: No results for input(s): VITAMINB12, FOLATE, FERRITIN, TIBC, IRON, RETICCTPCT in the last 72 hours. Urine analysis:    Component Value Date/Time   COLORURINE STRAW (A) 03/18/2018 0859   APPEARANCEUR CLEAR 03/18/2018 0859   LABSPEC 1.025 03/18/2018 0859   PHURINE 5.0 03/18/2018 0859   GLUCOSEU >=500 (A) 03/18/2018 0859   HGBUR NEGATIVE 03/18/2018 0859   BILIRUBINUR NEGATIVE 03/18/2018 0859   KETONESUR 20 (A) 03/18/2018 0859   PROTEINUR NEGATIVE 03/18/2018 0859   UROBILINOGEN 0.2 01/14/2007 1021   NITRITE NEGATIVE 03/18/2018 0859   LEUKOCYTESUR NEGATIVE 03/18/2018 0859   Sepsis Labs: @LABRCNTIP (procalcitonin:4,lacticidven:4) ) Recent Results (from the past 240 hour(s))  Blood culture (routine x 2)     Status: None (Preliminary result)   Collection Time: 04/09/18  5:14 PM  Result Value Ref Range Status   Specimen Description BLOOD RIGHT ANTECUBITAL  Final   Special Requests   Final    BOTTLES DRAWN AEROBIC AND ANAEROBIC Blood Culture results may not be optimal due to an excessive volume of blood received in culture bottles   Culture   Final    NO GROWTH 3 DAYS Performed at [facility], [address] [place], [place] [address]    Report Status PENDING  Incomplete  Blood culture  (routine x 2)     Status: None (Preliminary result)   Collection Time: 04/09/18  5:14 PM  Result Value Ref Range Status   Specimen Description BLOOD RIGHT HAND  Final   Special Requests   Final    BOTTLES DRAWN AEROBIC AND ANAEROBIC Blood Culture adequate volume   Culture   Final    NO GROWTH 3 DAYS Performed at [facility], [address] [place], [place] [address]    Report Status PENDING  Incomplete  Surgical pcr screen     Status: None   Collection Time: 04/10/18  9:00 AM  Result Value Ref Range Status   MRSA, PCR NEGATIVE NEGATIVE Final   Staphylococcus aureus NEGATIVE NEGATIVE Final    Comment: (NOTE) The Xpert SA Assay (FDA approved for NASAL specimens in patients 23 years of age and older), is one component of a comprehensive surveillance program. It is not intended to diagnose infection nor to guide or monitor treatment. Performed at [facility], [address] [place], [place] [address]   Aerobic/Anaerobic Culture (surgical/deep wound)     Status: None (Preliminary result)   Collection Time: 04/10/18 11:42 AM  Result Value Ref Range Status   Specimen Description ABSCESS  Final   Special Requests RIGHT BRAIN  Final   Gram Stain   Final    RARE WBC PRESENT, PREDOMINANTLY MONONUCLEAR NO ORGANISMS SEEN    Culture   Final    NO GROWTH 2 DAYS NO ANAEROBES ISOLATED; CULTURE IN PROGRESS FOR 5 DAYS Performed at [facility], [address] [place], [place] [address]    Report Status PENDING  Incomplete  MRSA PCR Screening     Status: None   Collection Time: 04/10/18  2:01 PM  Result Value Ref Range Status   MRSA by PCR  NEGATIVE NEGATIVE Final    Comment:        The GeneXpert MRSA Assay (FDA approved for NASAL specimens only), is one component of a comprehensive MRSA colonization surveillance program. It is not intended to diagnose MRSA infection nor to guide or monitor treatment for MRSA infections. Performed at [facility], [address], [place], [place] [address]          Radiology Studies: [place] Ekg Site Rite  Result Date: 04/12/2018 If [facility] image not attached, placement could not be confirmed due to current cardiac rhythm.     Scheduled Meds: . divalproex  1,000 mg Oral Daily  . docusate sodium  100 mg Oral BID  . gabapentin  300 mg Oral TID  . insulin aspart  0-20 Units Subcutaneous TID WC  . insulin glargine  35 Units Subcutaneous QHS  . metFORMIN  500 mg Oral BID WC  . pantoprazole (PROTONIX) IV  40 mg Intravenous QHS   Continuous Infusions: . 0.9 % NaCl with KCl 20 mEq / L 75 mL/hr at 04/11/18 2355  . cefTRIAXone (ROCEPHIN)  IV 2 g (04/12/18 1116)  . metronidazole 500 mg (04/12/18 1357)     LOS: 2 days      Calvert Cantor, MD [facility] Pager: [email or web address] Password [facility] 04/12/2018, 3:58 PM

## 2018-04-12 NOTE — Plan of Care (Signed)
  Problem: Clinical Measurements: Goal: Diagnostic test results will improve Outcome: Progressing   Problem: Activity: Goal: Risk for activity intolerance will decrease Outcome: Progressing   Problem: Clinical Measurements: Goal: Will remain free from infection Outcome: Progressing

## 2018-04-12 NOTE — Progress Notes (Signed)
Subjective: The patient is alert and pleasant.  He has no complaints.  According to his nurse has been drinking quite a lot of very drinks and eating a lot.  His mother is at the bedside.  Objective: Vital signs in last 24 hours: Temp:  [98.2 F (36.8 C)-98.9 F (37.2 C)] 98.2 F (36.8 C) (01/20 0400) Pulse Rate:  [99-128] 110 (01/20 0400) Resp:  [16-18] 16 (01/20 0400) BP: (129-157)/(88-142) 129/95 (01/20 0400) SpO2:  [95 %-99 %] 97 % (01/20 0400) Estimated body mass index is 32.45 kg/m as calculated from the following:   Height as of this encounter: 6\' 8"  (2.032 m).   Weight as of this encounter: 134 kg.   Intake/Output from previous day: 01/19 0701 - 01/20 0700 In: 2298.6 [P.O.:680; I.V.:1368.6; IV Piggyback:250] Out: 2900 [Urine:2900] Intake/Output this shift: No intake/output data recorded.  Physical exam the patient is alert and oriented.  His speech and strength is normal.  His dressing is clean and dry.  Lab Results: Recent Labs    04/11/18 0647 04/12/18 0541  WBC 18.0* 12.7*  HGB 15.4 13.8  HCT 46.6 41.5  PLT 350 302   BMET Recent Labs    04/11/18 0647 04/12/18 0541  NA 133* 134*  K 3.7 4.2  CL 96* 95*  CO2 25 28  GLUCOSE 249* 284*  BUN 8 11  CREATININE 1.04 0.98  CALCIUM 9.1 8.9    Studies/Results: [place] Ekg Site Rite  Result Date: 04/12/2018 If Site Rite image not attached, placement could not be confirmed due to current cardiac rhythm.   Assessment/Plan: Postop day #2 status post craniotomy for drainage of cerebral abscess: The patient is doing well.  ID has seen the patient.  We are awaiting cultures.  He will need a PICC line.  I encouraged him to refrain from eating and drinking sugary drinks as his sugar has been high.  LOS: 2 days     Cristi Loron 04/12/2018, 8:02 AM

## 2018-04-12 NOTE — Progress Notes (Signed)
[facility]  Date of Admission:  04/09/2018     Total days of antibiotics 4 Vanco + Metronidazole + Ceftriaxone BID    Patient ID: Mason BuffReece A Trimarco is a 23 y.o. M with  Principal Problem:   Brain abscess Active Problems:   Seizures (HCC)   DM (diabetes mellitus), type 2 (HCC)   Dental abscess   Essential hypertension   pseudohyponatremia   Brain mass   . divalproex  1,000 mg Oral Daily  . docusate sodium  100 mg Oral BID  . gabapentin  300 mg Oral TID  . insulin aspart  0-20 Units Subcutaneous TID WC  . insulin glargine  35 Units Subcutaneous QHS  . metFORMIN  500 mg Oral BID WC  . pantoprazole (PROTONIX) IV  40 mg Intravenous QHS    SUBJECTIVE: Feeling OK today. Some pain from surgery site but no headaches or vision changes. He lives at home with his mother - he tells me that she needs to return to work for bills/rent and not certain she will be able to help with IV medications at home. He does not know what carbohydrates are and what he should be eating for diabetes management.    No Known Allergies  OBJECTIVE: Vitals:   04/11/18 2012 04/12/18 0057 04/12/18 0400 04/12/18 0800  BP: (!) 149/102 (!) 150/108 (!) 129/95 (!) 162/114  Pulse: (!) 113 99 (!) 110 99  Resp: 18 18 16    Temp: 98.8 F (37.1 C) 98.9 F (37.2 C) 98.2 F (36.8 C) 98.3 F (36.8 C)  TempSrc: Oral Oral Oral Oral  SpO2: 97% 99% 97% 98%  Weight:      Height:       Body mass index is 32.45 kg/m.  Physical Exam Constitutional:      Appearance: Normal appearance.     Comments: Resting quietly in bed. Playing video game on phone.   HENT:     Head: Normocephalic.     Comments: Right temporal dressing in place and clean/dry.     Mouth/Throat:     Mouth: Mucous membranes are moist.     Pharynx: Oropharynx is clear. No oropharyngeal exudate.  Eyes:     General: No scleral icterus.    Pupils: Pupils are equal, round, and reactive to light.  Cardiovascular:     Rate and Rhythm: Regular rhythm. Tachycardia present.     Heart sounds: No murmur.  Pulmonary:     Effort: Pulmonary effort is normal. No respiratory distress.     Breath sounds: Normal breath sounds.  Abdominal:     General: Bowel sounds are normal. There is no distension.     Palpations: Abdomen is soft.  Musculoskeletal:        General: No swelling.  Skin:    General: Skin is warm and dry.     Capillary Refill: Capillary refill takes less than 2 seconds.  Neurological:     General: No focal deficit present.     Mental Status: He is alert and oriented to person, place, and time.  Psychiatric:        Mood and Affect: Mood normal.     Lab Results Lab Results  Component Value Date   WBC 12.7 (H) 04/12/2018   HGB 13.8 04/12/2018   HCT 41.5 04/12/2018   MCV 87.9 04/12/2018   PLT 302 04/12/2018    Lab Results  Component Value Date   CREATININE 0.98 04/12/2018  BUN 11 04/12/2018   NA 134 (L) 04/12/2018   K 4.2 04/12/2018   CL 95 (L) 04/12/2018   CO2 28 04/12/2018    Lab Results  Component Value Date   ALT 9 04/12/2018   AST 10 (L) 04/12/2018   ALKPHOS 58 04/12/2018   BILITOT 0.6 04/12/2018     Microbiology: Blood culture 1/17 > NGTD Brain Aspiration 1/18 > NGTD  MRSA PCR 1/18 > neg    ASSESSMENT: 23 y.o. male with significantly uncontrolled diabetes with pyogenic brain abscess presenting with seizures following dental extractions. OK to place PICC line (ordered). Continue current antibiotic regimen and follow cultures. Will need 6-8 weeks of therapy. Will need repeat imaging nearing the end of treatment.   It sounds like his mother may not be able to care for him and work to pay bills - we discussed briefly idea of a skilled facility for a short time but he does not understand. Would consider involving social worker for discharge planning.   He would benefit from diabetes nutrition education (has been ordered) - he could not tell me what a carbohydrate was and  feels that his diabetes is under good control because he is on insulin.   PLAN: 1. Place PICC line (ordered) 2. Continue vanc + ceftriaxone bid + metronidazole  3. Follow cultures from brain abscess  4. Diabetes education as already arrange from primary team  5. Discharge planning - home vs SNF?    Rexene Alberts, MSN, NP-C [facility] Cell: [phone] Pager: [phone]  04/12/2018  10:23 AM

## 2018-04-12 NOTE — Progress Notes (Signed)
[facility]  [facility] Infusion Coordinator will follow patient's hospital course with ID team to support home infusion pharmacy services at DC for home IV ABX.  [facility] Infusion team will partner with patient's [facility] Agency of choice to ensure collaborative DC.  If patient discharges after hours, please call [phone].   Sedalia Muta 04/12/2018, 7:37 AM

## 2018-04-12 NOTE — Progress Notes (Signed)
Patient is eating and drinking a lot with large volume of output.  CBg is also  high 0400 CBg 381 with 20 unit of novo log given  Diet education given to pt  And mother at bedside to control CBG and fluid intake both verbalized understanding. Marland Kitchen

## 2018-04-12 NOTE — Care Management Note (Signed)
Case Management Note  Patient Details  Name: Mason Pratt MRN: [id number] Date of Birth: May 13, 1995  Subjective/Objective:                    Action/Plan: Plan is for patient to d/c home with PICC and IV abx. CM met with the patient and his mother and provided choice. They selected [facility]. Butch Penny and Pam with [facility] notified and accepted the referral. Mother to learn the administration of the abxs. CM following.  Expected Discharge Date:                  Expected Discharge Plan:  [place]  In-House Referral:     Discharge planning Services  CM Consult  Post Acute Care Choice:  Home Health Choice offered to:  Patient, Parent  DME Arranged:    DME Agency:     HH Arranged:  RN [place] Agency:     Status of Service:  In process, will continue to follow  If discussed at Long Length of Stay Meetings, dates discussed:    Additional Comments:  Pollie Friar, RN 04/12/2018, 1:52 PM

## 2018-04-12 NOTE — Telephone Encounter (Signed)
Noted  

## 2018-04-12 NOTE — Progress Notes (Signed)
Prior to getting report  On RN rounds noted pt was asleep, mother at bed side was also asleep.  Bed alarm was off.RN turned on the alarm . Pt woke up later went to   [place] room with minimal assist.  RN wanted to reset the bed alarm but pt refused. Education given to pt on safety concerns and pt still refused. Pt also refuse SCD to be applied. Pt reminded to call before getting oob . Call bell within pt reach. Nursing staff will continue to round on pt.hourly.

## 2018-04-12 NOTE — Plan of Care (Signed)
  RD consulted for nutrition education regarding diabetes.   Lab Results  Component Value Date   HGBA1C 14.9 (H) 04/10/2018    Mother at bedside. Patient unwilling to participate in RD diet education. RD provided "Carbohydrate Counting for People with Diabetes" handout from the Academy of Nutrition and Dietetics. Discussed different food groups and their effects on blood sugar, emphasizing carbohydrate-containing foods. Provided list of carbohydrates and recommended serving sizes of common foods.  Discussed importance of controlled and consistent carbohydrate intake throughout the day. Provided examples of ways to balance meals/snacks and encouraged intake of high-fiber, whole grain complex carbohydrates. Teach back method used.  Expect fair to poor compliance. Mother reports pt unwilling to listen to her most of the time. Patient understanding for decrease consumption of large amounts of sugar sweetened food items on managing blood glucose, however unwilling to modify dietary changes currently. Noted, mother reports pt takes novolog and lantus during the day at the same time. Discussed with mother and patient to speak with MD on clarifications on medication administration.   Roslyn Smiling, MS, RD, LDN Pager # [phone] After hours/ weekend pager # [phone]

## 2018-04-12 NOTE — Progress Notes (Addendum)
Inpatient Diabetes Program Recommendations  AACE/ADA: New Consensus Statement on Inpatient Glycemic Control (2015)  Target Ranges:  Prepandial:   less than 140 mg/dL      Peak postprandial:   less than 180 mg/dL (1-2 hours)      Critically ill patients:  140 - 180 mg/dL   Lab Results  Component Value Date   GLUCAP 268 (H) 04/12/2018   HGBA1C 14.9 (H) 04/10/2018    Review of Glycemic Control Results for Mason Pratt, Mason Pratt (MRN [id number]) as of 04/12/2018 14:19  Ref. Range 04/11/2018 23:54 04/12/2018 04:16 04/12/2018 07:35 04/12/2018 11:35  Glucose-Capillary Latest Ref Range: 70 - 99 mg/dL 277 (H) 381 (H) 135 (H) 268 (H)   Diabetes history: Type 2 DM Outpatient Diabetes medications: none Current orders for Inpatient glycemic control: Lantus 35 units QHS, Novolog 0-20 units TID, Metformin 500 mg BID  Inpatient Diabetes Program Recommendations:    Met with patient on 03/02/18, will plan to see again.   Consider increasing Lantus to 42 units QHS.   Noted increased post prandials. Consider adding Novolog 6 units TID (assuming patient is consuming >50% of meals).   Addendum'@1530'$ :   Spoke with patient and mother at length regarding outpatient management of diabetes. Patient was diagnosed at age 21 and was last seen by Dr Hartford Poli on 08/03/2017. Last DM coordinator consult was on 03/02/18. Since our discussion in Dec, patient is now willing to perform injections, but states, "I am not changing the way I eat." Mother is tearful, however, she feels she cannot force him to do anything.  Reviewed patient's last A1c of 14.9%. Explained what a A1c is and what it measures. Explained that with patient being off medications that this value is most likely more elevated. Also reviewed goal A1c with patient, importance of good glucose control @ home, and blood sugar goals. Reviewed at length the importance of establishing control, impact of BS to infection risks especially post op, role of pancreas, need for  additional insulin, vascular changes that occur, and comorbidites that include: mortality rates, amputations, CVA, MI ESRD and blindness.  Explained the importance of establishing a routine that worked for their family to ensure better compliance with insulin.  Patient has a meter but was not checking blood sugars. Formulated a plan with the mother and patient in an effort to begin priorities with self injection and taking as prescribed as priority goals. Patient is willing. Discussed with mother to start encouraging patient to take medications as prescribed and to start checking CBGs 2xs per day if able. Reminded of importance of follow up with Dr Johney Maine. They plan to make an appointment. They have no further questions at this time.   Thanks, Bronson Curb, MSN, RNC-OB Diabetes Coordinator [phone] (8a-5p)

## 2018-04-12 NOTE — Progress Notes (Signed)
Initial Nutrition Assessment  DOCUMENTATION CODES:   Obesity unspecified  INTERVENTION:  Provide Glucerna Shake po BID, each supplement provides 220 kcal and 10 grams of protein.  Diabetes diet education given.  NUTRITION DIAGNOSIS:   Increased nutrient needs related to wound healing as evidenced by estimated needs.  GOAL:   Patient will meet greater than or equal to 90% of their needs  MONITOR:   PO intake, Supplement acceptance, Labs, Weight trends, I & O's, Skin  REASON FOR ASSESSMENT:   Consult Assessment of nutrition requirement/status, Diet education  ASSESSMENT:   23 y.o. male with significantly uncontrolled diabetes with pyogenic brain abscess presenting with seizures following dental extractions.   Procedure (1/18): Right temporal craniotomy for drainage of right temporal brain abscess   Meal completion has been 100%. Appetite good with no other difficulties. Mother at bedside. PICC line to be placed for IV antibiotics. RD to order nutritional supplements to aid in caloric and protein needs as well as in healing. RD additionally consulted for diet education regarding uncontrolled diabetes. Diet education given.   Labs and medications reviewed. CBG's 176-368 mg/dL.  NUTRITION - FOCUSED PHYSICAL EXAM:    Most Recent Value  Orbital Region  No depletion  Upper Arm Region  No depletion  Thoracic and Lumbar Region  No depletion  Buccal Region  No depletion  Temple Region  No depletion  Clavicle Bone Region  No depletion  Clavicle and Acromion Bone Region  No depletion  Scapular Bone Region  No depletion  Dorsal Hand  No depletion  Patellar Region  No depletion  Anterior Thigh Region  No depletion  Posterior Calf Region  No depletion  Edema (RD Assessment)  None  Hair  Reviewed  Eyes  Reviewed  Mouth  Reviewed  Skin  Reviewed  Nails  Reviewed       Diet Order:   Diet Order            Diet heart healthy/carb modified Room service appropriate? Yes;  Fluid consistency: Thin  Diet effective now              EDUCATION NEEDS:   Education needs have been addressed  Skin:  Skin Assessment: Skin Integrity Issues: Skin Integrity Issues:: Incisions Incisions: R head  Last BM:  1/16  Height:   Ht Readings from Last 1 Encounters:  04/10/18 6\' 8"  (2.032 m)    Weight:   Wt Readings from Last 1 Encounters:  04/10/18 134 kg    Ideal Body Weight:  103 kg  BMI:  Body mass index is 32.45 kg/m.  Estimated Nutritional Needs:   Kcal:  2400-2600  Protein:  120-140 grams  Fluid:  >/= 2.4 L/day    Roslyn Smiling, MS, RD, LDN Pager # [phone] After hours/ weekend pager # [phone]

## 2018-04-13 LAB — GLUCOSE, CAPILLARY
Glucose-Capillary: 332 mg/dL — ABNORMAL HIGH (ref 70–99)
Glucose-Capillary: 321 mg/dL — ABNORMAL HIGH (ref 70–99)
Glucose-Capillary: 132 mg/dL — ABNORMAL HIGH (ref 70–99)
Glucose-Capillary: 406 mg/dL — ABNORMAL HIGH (ref 70–99)
Glucose-Capillary: 484 mg/dL — ABNORMAL HIGH (ref 70–99)

## 2018-04-13 MED ORDER — LOSARTAN POTASSIUM 50 MG PO TABS: 50.0000 mg | ORAL_TABLET | Freq: Every day | ORAL | 0 refills | Status: AC

## 2018-04-13 MED ORDER — INSULIN GLARGINE 100 UNIT/ML ~~LOC~~ SOLN
45.0000 [IU] | Freq: Every day | SUBCUTANEOUS | Status: DC
  Administered 2018-04-13: 45 [IU] via SUBCUTANEOUS
  Filled 2018-04-13 (×2): qty 0.45

## 2018-04-13 MED ORDER — BISACODYL 10 MG RE SUPP: 10.0000 mg | Freq: Every day | RECTAL | 0 refills | Status: AC | PRN

## 2018-04-13 MED ORDER — POLYETHYLENE GLYCOL 3350 17 G PO PACK: 17.0000 g | PACK | Freq: Two times a day (BID) | ORAL | 0 refills | Status: AC | PRN

## 2018-04-13 MED ORDER — DOCUSATE SODIUM 100 MG PO CAPS: 100.0000 mg | ORAL_CAPSULE | Freq: Two times a day (BID) | ORAL | 0 refills | Status: AC

## 2018-04-13 MED ORDER — METRONIDAZOLE 500 MG PO TABS: 500.0000 mg | ORAL_TABLET | Freq: Three times a day (TID) | ORAL | 0 refills | Status: AC

## 2018-04-13 MED ORDER — INSULIN ASPART 100 UNIT/ML ~~LOC~~ SOLN
5.0000 [IU] | Freq: Once | SUBCUTANEOUS | Status: AC
  Administered 2018-04-13: 5 [IU] via SUBCUTANEOUS

## 2018-04-13 MED ORDER — HYDROCODONE-ACETAMINOPHEN 5-325 MG PO TABS: 1.0000 | ORAL_TABLET | ORAL | 0 refills | Status: AC | PRN

## 2018-04-13 MED ORDER — CEFTRIAXONE IV (FOR PTA / DISCHARGE USE ONLY): 2.0000 g | Freq: Two times a day (BID) | INTRAVENOUS | 0 refills | Status: AC

## 2018-04-13 MED ORDER — LOSARTAN POTASSIUM 50 MG PO TABS
50.0000 mg | ORAL_TABLET | Freq: Every day | ORAL | Status: DC
  Administered 2018-04-13 – 2018-04-14 (×2): 50 mg via ORAL
  Filled 2018-04-13 (×2): qty 1

## 2018-04-13 MED ORDER — INSULIN GLARGINE 100 UNIT/ML ~~LOC~~ SOLN: 40.0000 [IU] | Freq: Every day | SUBCUTANEOUS | 0 refills | Status: DC

## 2018-04-13 MED ORDER — FLEET ENEMA 7-19 GM/118ML RE ENEM: 1.0000 | ENEMA | Freq: Once | RECTAL | 0 refills | Status: AC | PRN

## 2018-04-13 MED ORDER — PANTOPRAZOLE SODIUM 40 MG PO TBEC
40.0000 mg | DELAYED_RELEASE_TABLET | Freq: Every day | ORAL | Status: DC
  Administered 2018-04-13 – 2018-04-14 (×2): 40 mg via ORAL
  Filled 2018-04-13 (×2): qty 1

## 2018-04-13 NOTE — Progress Notes (Addendum)
Inpatient Diabetes Program Recommendations  AACE/ADA: New Consensus Statement on Inpatient Glycemic Control (2015)  Target Ranges:  Prepandial:   less than 140 mg/dL      Peak postprandial:   less than 180 mg/dL (1-2 hours)      Critically ill patients:  140 - 180 mg/dL   Lab Results  Component Value Date   GLUCAP 332 (H) 04/13/2018   HGBA1C 14.9 (H) 04/10/2018    Review of Glycemic Control Results for Mason Pratt, Mason Pratt (MRN [id number]) as of 04/13/2018 09:59  Ref. Range 04/12/2018 11:35 04/12/2018 16:16 04/12/2018 21:21 04/13/2018 06:34  Glucose-Capillary Latest Ref Range: 70 - 99 mg/dL 458 (H) 099 (H) 833 (H) 332 (H)   Diabetes history:Type 2 DM Outpatient Diabetes medications:none Current orders for Inpatient glycemiccontrol:Lantus 40 units QHS, Novolog 0-20 units TID, Metformin 500 mg BID, Novolog 8 units TID  Inpatient Diabetes Program Recommendations:    Consider increasing Lantus to 48 units QHS and Novolog 10 units TID (assuming that patient is consuming >50%).   Thanks, Lujean Rave, MSN, RNC-OB Diabetes Coordinator [phone] (8a-5p)

## 2018-04-13 NOTE — Progress Notes (Signed)
Went to patient room to place PICC line. Started to explain the risk and benefits, patient would like the mother to be in the room. Called the mother on the phone, mother aware that may not be in the room during the procedure. Wanting to wait until tomorrow to place a PICC line when she gets here. Primary RN made aware.

## 2018-04-13 NOTE — Progress Notes (Signed)
PHARMACY CONSULT NOTE FOR:  OUTPATIENT  PARENTERAL ANTIBIOTIC THERAPY (OPAT)  Indication: Brain Abscess Regimen: Ceftriaxone 2 gm IV Q 12 hours + Metronidazole 500 mg PO Q 8 hours End date: 05/21/2018  IV antibiotic discharge orders are pended. To discharging provider:  please sign these orders via discharge navigator,  Select New Orders & click on the button choice - Manage This Unsigned Work.     Thank you for allowing pharmacy to be a part of this patient's care.  Sharin Mons, PharmD, BCPS, BCIDP Infectious Diseases Clinical Pharmacist Phone: [phone] 04/13/2018, 11:04 AM

## 2018-04-13 NOTE — Progress Notes (Addendum)
PROGRESS NOTE    Mason Pratt   UKG:[id number]  DOB: [birth year]  DOA: 04/09/2018 PCP: System, Provider Not In   Brief Narrative:  Mason Pratt  is a 23 y.o. male with medical history significant of recently onset of seizures (last month), uncontrolled DM, developmental delay,  with recent extraction of his teeth who was on antibiotics comes in with a CT scan which shows a brain abscess . The CT was ordered by neurology as an outpatient for seizures.  Noted to be hyperglycemic with CBG in 500s in the ED. [facility] asked to admit.   Recently admitted from 03/18/18-03/19/18 and placed on Depakote after a neurology eval. And head CT and MRI of the brain did not show an abscess at that time.  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 ID was consulted to assist with management an evaluated the patient on 04/11/18.    Subjective: No complaints.     Assessment & Plan:   Principal Problem:   Brain abscess- suspected to have started as a dental abscess - appreciate ID eval- cont antibiotics per ID-  Abscess culture negative - PICC line to be placed and then will need to discharge with Ceftriaxone and oral Flagyl for 6 wks per ID  Active Problems:   Seizures - cont Depakote    DM (diabetes mellitus), type 2 - uncontrolled  With pseudohyponatermia - on 30 U of Lantus and Glucophage at home along with Novolog on a sliding scale - will increase Lantus again today      Essential hypertension - PRN Hydralazine and Labetalol ordered   Obesity   Body mass index is 32.45 kg/m.  Developmental delay   Time spent in minutes: 11- discussed plan with mother and case manager  DVT prophylaxis: SCDs Code Status: Full code Family Communication: mother Disposition Plan: home after PICC Consultants:   NS  ID  PCCM Procedures:   Procedures: Right temporal craniotomy for drainage of right temporal brain abscess done on 04/10/2018 by Dr. Tressie Stalker   Antimicrobials:  Anti-infectives (From admission, onward)   Start     Dose/Rate Route Frequency Ordered Stop   04/13/18 0000  cefTRIAXone (ROCEPHIN) IVPB     2 g Intravenous Every 12 hours 04/13/18 1215 05/21/18 2359   04/13/18 0000  metroNIDAZOLE (FLAGYL) 500 MG tablet     500 mg Oral 3 times daily 04/13/18 1215 05/21/18 2359   04/12/18 0000  vancomycin (VANCOCIN) 1,250 mg in sodium chloride 0.9 % 250 mL IVPB  Status:  Discontinued     1,250 mg 166.7 mL/hr over 90 Minutes Intravenous Every 8 hours 04/11/18 1348 04/12/18 1447   04/11/18 0800  cefTRIAXone (ROCEPHIN) 2 g in sodium chloride 0.9 % 100 mL IVPB  Status:  Discontinued     2 g 200 mL/hr over 30 Minutes Intravenous Every 24 hours 04/10/18 1257 04/10/18 1410   04/10/18 2200  cefTRIAXone (ROCEPHIN) 2 g in sodium chloride 0.9 % 100 mL IVPB     2 g 200 mL/hr over 30 Minutes Intravenous Every 12 hours 04/10/18 1410     04/10/18 1300  metroNIDAZOLE (FLAGYL) IVPB 500 mg     500 mg 100 mL/hr over 60 Minutes Intravenous Every 8 hours 04/10/18 1257     04/10/18 1123  bacitracin 50,000 Units in sodium chloride 0.9 % 500 mL irrigation  Status:  Discontinued       As needed 04/10/18 1154 04/10/18 1221   04/10/18 1000  vancomycin (VANCOCIN) 2,500 mg in sodium chloride 0.9 % 500 mL IVPB  Status:  Discontinued     2,500 mg 250 mL/hr over 120 Minutes Intravenous Every 12 hours 04/10/18 0114 04/11/18 1348   04/09/18 2000  cefTRIAXone (ROCEPHIN) 2 g in sodium chloride 0.9 % 100 mL IVPB  Status:  Discontinued     2 g 200 mL/hr over 30 Minutes Intravenous Every 12 hours 04/09/18 1647 04/10/18 1350   04/09/18 1700  vancomycin (VANCOCIN) 2,500 mg in sodium chloride 0.9 % 500 mL IVPB     2,500 mg 250 mL/hr over 120 Minutes Intravenous  Once 04/09/18 1647 04/09/18 2149   04/09/18 1700  metroNIDAZOLE (FLAGYL) IVPB 500 mg  Status:  Discontinued     500 mg 100 mL/hr over 60 Minutes Intravenous Every 8 hours 04/09/18 1647 04/10/18 1350        Objective: Vitals:   04/13/18 0451 04/13/18 0756 04/13/18 1203 04/13/18 1603  BP: (!) 157/113 137/78 122/74 124/79  Pulse: 96 89 97 94  Resp: 18 18 18 17   Temp: 98.1 F (36.7 C) 98 F (36.7 C) 98.5 F (36.9 C) 98 F (36.7 C)  TempSrc: Oral Oral    SpO2: 98% 98% 97% 100%  Weight:      Height:       No intake or output data in the 24 hours ending 04/13/18 1636 Filed Weights   04/09/18 1723 04/10/18 1356  Weight: 132.5 kg 134 kg    Examination: General exam: Appears comfortable  HEENT: PERRLA, oral mucosa moist, no sclera icterus or thrush Respiratory system: Clear to auscultation. Respiratory effort normal. Cardiovascular system: S1 & S2 heard,  No murmurs  Gastrointestinal system: Abdomen soft, non-tender, nondistended. Normal bowel sound. No organomegaly Central nervous system: Alert and oriented. No focal neurological deficits. Extremities: No cyanosis, clubbing or edema Skin: No rashes or ulcers- staples on right scalp noted- incision is clean Psychiatry:  Mood & affect appropriate.      Data Reviewed: I have personally reviewed following labs and imaging studies  CBC: Recent Labs  Lab 04/09/18 1715 04/10/18 0916 04/11/18 0647 04/12/18 0541  WBC 16.2* 13.1* 18.0* 12.7*  NEUTROABS 13.6* 10.6* 15.1* 9.7*  HGB 15.0 15.2 15.4 13.8  HCT 45.8 44.3 46.6 41.5  MCV 84.5 84.1 86.1 87.9  PLT 364 381 350 302   Basic Metabolic Panel: Recent Labs  Lab 04/09/18 1715 04/10/18 0747 04/10/18 0916 04/11/18 0647 04/12/18 0541  NA 125* 136  --  133* 134*  K 4.1 3.4*  --  3.7 4.2  CL 89* 95*  --  96* 95*  CO2 23 28  --  25 28  GLUCOSE 512* 191*  --  249* 284*  BUN 9 5*  --  8 11  CREATININE 0.88 0.71  --  1.04 0.98  CALCIUM 9.4 9.4  --  9.1 8.9  MG  --   --  1.9 1.9 2.0  PHOS  --   --  3.5 4.6 3.2   GFR: Estimated Creatinine Clearance: 186 mL/min (by C-G formula based on SCr of 0.98 mg/dL). Liver Function Tests: Recent Labs  Lab 04/09/18 1715  04/10/18 0916 04/11/18 0647 04/12/18 0541  AST 11* 10* 13* 10*  ALT 10 10 10 9   ALKPHOS 80 60 68 58  BILITOT 0.8 0.8 0.7 0.6  PROT 7.1 6.9 7.1 6.4*  ALBUMIN 3.8 3.3* 3.5 3.0*   No results for input(s): LIPASE, AMYLASE in the last 168 hours. No results for input(s): AMMONIA  in the last 168 hours. Coagulation Profile: No results for input(s): INR, PROTIME in the last 168 hours. Cardiac Enzymes: No results for input(s): CKTOTAL, CKMB, CKMBINDEX, TROPONINI in the last 168 hours. BNP (last 3 results) No results for input(s): PROBNP in the last 8760 hours. HbA1C: No results for input(s): HGBA1C in the last 72 hours. CBG: Recent Labs  Lab 04/12/18 1135 04/12/18 1616 04/12/18 2121 04/13/18 0634 04/13/18 1200  GLUCAP 268* 350* 304* 332* 321*   Lipid Profile: No results for input(s): CHOL, HDL, LDLCALC, TRIG, CHOLHDL, LDLDIRECT in the last 72 hours. Thyroid Function Tests: No results for input(s): TSH, T4TOTAL, FREET4, T3FREE, THYROIDAB in the last 72 hours. Anemia Panel: No results for input(s): VITAMINB12, FOLATE, FERRITIN, TIBC, IRON, RETICCTPCT in the last 72 hours. Urine analysis:    Component Value Date/Time   COLORURINE STRAW (A) 03/18/2018 0859   APPEARANCEUR CLEAR 03/18/2018 0859   LABSPEC 1.025 03/18/2018 0859   PHURINE 5.0 03/18/2018 0859   GLUCOSEU >=500 (A) 03/18/2018 0859   HGBUR NEGATIVE 03/18/2018 0859   BILIRUBINUR NEGATIVE 03/18/2018 0859   KETONESUR 20 (A) 03/18/2018 0859   PROTEINUR NEGATIVE 03/18/2018 0859   UROBILINOGEN 0.2 01/14/2007 1021   NITRITE NEGATIVE 03/18/2018 0859   LEUKOCYTESUR NEGATIVE 03/18/2018 0859   Sepsis Labs: @LABRCNTIP (procalcitonin:4,lacticidven:4) ) Recent Results (from the past 240 hour(s))  Blood culture (routine x 2)     Status: None (Preliminary result)   Collection Time: 04/09/18  5:14 PM  Result Value Ref Range Status   Specimen Description BLOOD RIGHT ANTECUBITAL  Final   Special Requests   Final    BOTTLES DRAWN  AEROBIC AND ANAEROBIC Blood Culture results may not be optimal due to an excessive volume of blood received in culture bottles   Culture   Final    NO GROWTH 4 DAYS Performed at [facility], [address] [place], [place] [address]    Report Status PENDING  Incomplete  Blood culture (routine x 2)     Status: None (Preliminary result)   Collection Time: 04/09/18  5:14 PM  Result Value Ref Range Status   Specimen Description BLOOD RIGHT HAND  Final   Special Requests   Final    BOTTLES DRAWN AEROBIC AND ANAEROBIC Blood Culture adequate volume   Culture   Final    NO GROWTH 4 DAYS Performed at [facility], [address] [place], [place] [address]    Report Status PENDING  Incomplete  Surgical pcr screen     Status: None   Collection Time: 04/10/18  9:00 AM  Result Value Ref Range Status   MRSA, PCR NEGATIVE NEGATIVE Final   Staphylococcus aureus NEGATIVE NEGATIVE Final    Comment: (NOTE) The Xpert SA Assay (FDA approved for NASAL specimens in patients 28 years of age and older), is one component of a comprehensive surveillance program. It is not intended to diagnose infection nor to guide or monitor treatment. Performed at [facility], [address], [place], [place] [address]   Aerobic/Anaerobic Culture (surgical/deep wound)     Status: None (Preliminary result)   Collection Time: 04/10/18 11:42 AM  Result Value Ref Range Status   Specimen Description ABSCESS  Final   Special Requests RIGHT BRAIN  Final   Gram Stain   Final    RARE WBC PRESENT, PREDOMINANTLY MONONUCLEAR NO ORGANISMS SEEN    Culture   Final    NO GROWTH 3 DAYS NO ANAEROBES ISOLATED; CULTURE IN PROGRESS FOR 5  DAYS Performed at [facility], [address] [place], [place] [address]    Report Status PENDING  Incomplete  MRSA PCR Screening     Status: None   Collection Time: 04/10/18  2:01 PM  Result Value Ref Range Status   MRSA by PCR NEGATIVE NEGATIVE Final    Comment:         The GeneXpert MRSA Assay (FDA approved for NASAL specimens only), is one component of a comprehensive MRSA colonization surveillance program. It is not intended to diagnose MRSA infection nor to guide or monitor treatment for MRSA infections. Performed at [facility], [address] [place], [place] [address]          Radiology Studies: [place] Ekg Site Rite  Result Date: 04/12/2018 [facility] not [facility], placement could not be confirmed due to current cardiac rhythm.     Scheduled Meds: . divalproex  1,000 mg Oral Daily  . docusate sodium  100 mg Oral BID  . feeding supplement (GLUCERNA SHAKE)  237 mL Oral BID BM  . gabapentin  300 mg Oral TID  . insulin aspart  0-20 Units Subcutaneous TID WC  . insulin aspart  8 Units Subcutaneous TID WC  . insulin glargine  40 Units Subcutaneous QHS  . losartan  50 mg Oral Daily  . metFORMIN  500 mg Oral BID WC  . pantoprazole (PROTONIX) IV  40 mg Intravenous QHS   Continuous Infusions: . cefTRIAXone (ROCEPHIN)  IV 2 g (04/13/18 0930)  . metronidazole 500 mg (04/13/18 1240)     LOS: 3 days      Calvert CantorSaima Stefanos Haynesworth, MD [facility] Pager: [email or web address] Password [email or web address] 04/13/2018, 4:36 PM

## 2018-04-13 NOTE — Progress Notes (Signed)
[facility]   Reason for visit: Follow up on brain abscess  Interval History: no complaints, mother not at bedside, no fever, no chills; no associated rash   Physical Exam: Constitutional:  Vitals:   04/13/18 0451 04/13/18 0756  BP: (!) 157/113 137/78  Pulse: 96 89  Resp: 18 18  Temp: 98.1 F (36.7 C) 98 F (36.7 C)  SpO2: 98% 98%   patient appears in NAD Eyes: anicteric HENT: no thrush Respiratory: Normal respiratory effort; CTA B Cardiovascular: RRR GI: soft, nt, nd  Review of Systems: Constitutional: negative for fevers and chills Gastrointestinal: negative for nausea and diarrhea  Lab Results  Component Value Date   WBC 12.7 (H) 04/12/2018   HGB 13.8 04/12/2018   HCT 41.5 04/12/2018   MCV 87.9 04/12/2018   PLT 302 04/12/2018    Lab Results  Component Value Date   CREATININE 0.98 04/12/2018   BUN 11 04/12/2018   NA 134 (L) 04/12/2018   K 4.2 04/12/2018   CL 95 (L) 04/12/2018   CO2 28 04/12/2018    Lab Results  Component Value Date   ALT 9 04/12/2018   AST 10 (L) 04/12/2018   ALKPHOS 58 04/12/2018     Microbiology: Recent Results (from the past 240 hour(s))  Blood culture (routine x 2)     Status: None (Preliminary result)   Collection Time: 04/09/18  5:14 PM  Result Value Ref Range Status   Specimen Description BLOOD RIGHT ANTECUBITAL  Final   Special Requests   Final    BOTTLES DRAWN AEROBIC AND ANAEROBIC Blood Culture results may not be optimal due to an excessive volume of blood received in culture bottles   Culture   Final    NO GROWTH 3 DAYS Performed at [facility], [address] [place], [place] [address]    Report Status PENDING  Incomplete  Blood culture (routine x 2)     Status: None (Preliminary result)   Collection Time: 04/09/18  5:14 PM  Result Value Ref Range Status   Specimen Description BLOOD RIGHT HAND  Final   Special Requests   Final    BOTTLES DRAWN AEROBIC AND ANAEROBIC Blood  Culture adequate volume   Culture   Final    NO GROWTH 3 DAYS Performed at [facility], [address], [place], [place] [address]    Report Status PENDING  Incomplete  Surgical pcr screen     Status: None   Collection Time: 04/10/18  9:00 AM  Result Value Ref Range Status   MRSA, PCR NEGATIVE NEGATIVE Final   Staphylococcus aureus NEGATIVE NEGATIVE Final    Comment: (NOTE) The Xpert SA Assay (FDA approved for NASAL specimens in patients 13 years of age and older), is one component of a comprehensive surveillance program. It is not intended to diagnose infection nor to guide or monitor treatment. Performed at [facility], [address], [place], [place] [address]   Aerobic/Anaerobic Culture (surgical/deep wound)     Status: None (Preliminary result)   Collection Time: 04/10/18 11:42 AM  Result Value Ref Range Status   Specimen Description ABSCESS  Final   Special Requests RIGHT BRAIN  Final   Gram Stain   Final    RARE WBC PRESENT, PREDOMINANTLY MONONUCLEAR NO ORGANISMS SEEN    Culture   Final    NO GROWTH 2 DAYS NO ANAEROBES ISOLATED; CULTURE IN PROGRESS FOR 5 DAYS Performed at [facility], [address] [place], [place] [address]    Report Status PENDING  Incomplete  MRSA PCR Screening     Status: None   Collection Time: 04/10/18  2:01 PM  Result Value Ref Range Status   MRSA by PCR NEGATIVE NEGATIVE Final    Comment:        The GeneXpert MRSA Assay (FDA approved for NASAL specimens only), is one component of a comprehensive MRSA colonization surveillance program. It is not intended to diagnose MRSA infection nor to guide or monitor treatment for MRSA infections. Performed at [facility], [address], [place], [place] [address]     Impression/Plan:  1. Brain abscess - no growth on culture.  Will continue with ceftriaxone IV 2 grams every 12 hours and oral flagyl. Treatment for at least 6 weeks, through February 28th.   Leave  picc line in until seen in clinic Antibiotics per home health protocol  2.  Medication monitoring - weekly cbc, cmp  3.  Access - picc line ordered   Ok from ID standpoint for discharge. Follow up arranged

## 2018-04-13 NOTE — Progress Notes (Signed)
Subjective: The patient is alert and pleasant.  His mother is at the bedside.  Objective: Vital signs in last 24 hours: Temp:  [97.2 F (36.2 C)-98.5 F (36.9 C)] 98.1 F (36.7 C) (01/21 0451) Pulse Rate:  [96-112] 96 (01/21 0451) Resp:  [18-20] 18 (01/21 0451) BP: (140-169)/(76-114) 157/113 (01/21 0451) SpO2:  [92 %-100 %] 98 % (01/21 0451) Estimated body mass index is 32.45 kg/m as calculated from the following:   Height as of this encounter: 6\' 8"  (2.032 m).   Weight as of this encounter: 134 kg.   Intake/Output from previous day: No intake/output data recorded. Intake/Output this shift: No intake/output data recorded.  Physical exam patient is alert and oriented.  His strength and speech is normal.  His craniotomy wound is healing well.  Lab Results: Recent Labs    04/11/18 0647 04/12/18 0541  WBC 18.0* 12.7*  HGB 15.4 13.8  HCT 46.6 41.5  PLT 350 302   BMET Recent Labs    04/11/18 0647 04/12/18 0541  NA 133* 134*  K 3.7 4.2  CL 96* 95*  CO2 25 28  GLUCOSE 249* 284*  BUN 8 11  CREATININE 1.04 0.98  CALCIUM 9.1 8.9    Studies/Results: [place] Ekg Site Rite  Result Date: 04/12/2018 If Site Rite image not attached, placement could not be confirmed due to current cardiac rhythm.   Assessment/Plan: Postop day #2: The patient is doing well.  He is okay to be discharged from my point of view, with a PICC line and home IV antibiotics once the arrangements have been made.  LOS: 3 days     Cristi Loron 04/13/2018, 7:52 AM

## 2018-04-13 NOTE — Care Management Note (Addendum)
Case Management Note  Patient Details  Name: Mason Pratt MRN: [id number] Date of Birth: [birth year]  Subjective/Objective:                    Action/Plan: Pt discharging home with [facility] services. Pam with [facility] IV therapy went over education with patient and mother yesterday. HH RN to see pt in the am for first home administration. Pt to d/c home after 5 pm dose of antibiotic and PICC placement.  Mother to provide transport home.   Addendum: (1600): pt did not receive PICC today. Pts mother not available today for consent. Per mother, Dr Lovell Sheehan told her the patient would not d/c until tomorrow. Pam with [facility] IV therapy, bedside RN and MD updated. CM asked bedside RN to please let IV team know mother will be here tomorrow after 11:30 am.   Expected Discharge Date:  04/13/18               Expected Discharge Plan:  Home w Home Health Services  In-House Referral:     Discharge planning Services  CM Consult  Post Acute Care Choice:  Home Health Choice offered to:  Patient, Parent  DME Arranged:    DME Agency:     HH Arranged:  RN HH Agency:     Status of Service:  Completed, signed off  If discussed at Long Length of Stay Meetings, dates discussed:    Additional Comments:  Kermit Balo, RN 04/13/2018, 12:40 PM

## 2018-04-13 NOTE — Discharge Summary (Signed)
Physician Discharge Summary  Mason Pratt JWJ:[id number] DOB: July 19, 1995 DOA: 04/09/2018  PCP: System, Provider Not In  Admit date: 04/09/2018 Discharge date: 04/14/2018  Admitted From: home  Disposition:  home   Recommendations for Outpatient Follow-up:  1. F/u on BP and uncontrolled DM  Home Health:  ordered     Discharge Condition:  stable CODE STATUS:  Full code   Diet recommendation:  Carb modified, heart healthy Consultations:  NS  ID  PCCM    Discharge Diagnoses:  Principal Problem:   Brain abscess Active Problems:   Seizures ([place])   DM (diabetes mellitus), type 2 ([place])   Dental abscess   Essential hypertension   Pseudohyponatremia    Brief Summary: Mason Pratt  is a 23 y.o.malewith medical history significant ofrecently onset of seizures (last month), uncontrolled DM, developmental delay,  with recent extraction of his teeth who was on antibiotics comes in with a CT scan which shows a brain abscess . The CT was ordered by neurology as an outpatient for seizures.  Noted to be hyperglycemic with CBG in 500s in the ED. Initially the ICU team was asked to admit him and they felt that this patient did not require the ICU. [facility] subsequently asked to admit.   Recently admitted from 03/18/18-03/19/18 and placed on Depakote after a neurology eval. A head CT and MRI of the brain did not show an abscess at that time.  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 ID was consulted to assist with management an evaluated the patient on 04/11/18.    Hospital Course:  Principal Problem:   Brain abscess- suspected to have started as a dental abscess - s/p right temporal craniotomy for I and D on 1/18- cultures have not grown any organisms- he is doing well overall after surgery -  ID eval has evaluated the patient as well and feel he should be discharged with Ceftriaxone and oral Flagyl for 6 wks- stop date 2/28 - - he has a PICC  line and home health has been ordered- his mother has received teaching in the hospital as well.   Active Problems:   Seizure disorder - cont Depakote    DM (diabetes mellitus), type 2 -sugars uncontrolled with pseudohyponatermia - A1c 14.9 - on 30 U of Lantus and Glucophage at home along with Novolog on a sliding scale- eats very erratically and does not adhere to a diabetic diet - will discharge with 52 U of lantus and 10 U of Novolog with meals based on what he has been receiving here and how is sugars have been trending-       Essential hypertension - have added Losartan- he was not on any antihypertensives at home previously   Obesity   Body mass index is 32.45 kg/m.  Developmental delay - his mother is his caretaker   Discharge Exam: Vitals:   04/14/18 0409 04/14/18 0802  BP: (!) 122/106 126/79  Pulse: 93 83  Resp: 13 16  Temp:  (!) 97.5 F (36.4 C)  SpO2: 100% 100%   Vitals:   04/13/18 2006 04/13/18 2339 04/14/18 0409 04/14/18 0802  BP: (!) 121/49 (!) 140/95 (!) 122/106 126/79  Pulse: (!) 102 95 93 83  Resp: '17 14 13 16  '$ Temp: 98.9 F (37.2 C) 97.9 F (36.6 C)  (!) 97.5 F (36.4 C)  TempSrc: Oral Oral  Axillary  SpO2: 98% 99% 100% 100%  Weight:      Height:  General: Pt is alert, awake, not in acute distress- staples on right scalp noted- incision is clean Cardiovascular: RRR, S1/S2 +, no rubs, no gallops Respiratory: CTA bilaterally, no wheezing, no rhonchi Abdominal: Soft, NT, ND, bowel sounds + Extremities: no edema, no cyanosis   Discharge Instructions  Discharge Instructions    Diet - low sodium heart healthy   Complete by:  As directed    Diet Carb Modified   Complete by:  As directed    Home infusion instructions [facility] May follow [place] Dosing Protocol; May administer Cathflo as needed to maintain patency of vascular access device.; Flushing of vascular access device: per [facility] Protocol: 0.9% NaCl pre/post  medica...   Complete by:  As directed    Instructions:  May follow [place] Dosing Protocol   Instructions:  May administer Cathflo as needed to maintain patency of vascular access device.   Instructions:  Flushing of vascular access device: per [facility] Protocol: 0.9% NaCl pre/post medication administration and prn patency; Heparin 100 u/ml, 34m for implanted ports and Heparin 10u/ml, 569mfor all other central venous catheters.   Instructions:  May follow [facility] Anaphylaxis Protocol for First Dose Administration in the home: 0.9% NaCl at 25-50 ml/hr to maintain IV access for protocol meds. Epinephrine 0.3 ml IV/IM PRN and Benadryl 25-50 IV/IM PRN s/s of anaphylaxis.   Instructions:  [facility] Coordinator (RN) to assist per patient IV care needs in the home PRN.   Increase activity slowly   Complete by:  As directed      Allergies as of 04/14/2018   No Known Allergies     Medication List    STOP taking these medications   ibuprofen 800 MG tablet Commonly known as:  ADVIL,MOTRIN   oxyCODONE-acetaminophen 5-325 MG tablet Commonly known as:  PERCOCET     TAKE these medications   bisacodyl 10 MG suppository Commonly known as:  DULCOLAX Place 1 suppository (10 mg total) rectally daily as needed for moderate constipation.   blood glucose meter kit and supplies Kit Dispense based on patient and insurance preference. Use up to four times daily as directed. (FOR ICD-9 250.00, 250.01). For QAC - HS accuchecks.   cefTRIAXone  IVPB Commonly known as:  ROCEPHIN Inject 2 g into the vein every 12 (twelve) hours. Indication:  Brain abscess Last Day of Therapy:  05/21/2018 Labs - Once weekly:  CBC/D and BMP, Labs - Every other week:  ESR and CRP   divalproex 500 MG 24 hr tablet Commonly known as:  DEPAKOTE ER Take 2 tablets (1,000 mg total) by mouth daily.   docusate sodium 100 MG capsule Commonly known as:  COLACE Take 1 capsule (100 mg total) by mouth 2 (two) times daily.    gabapentin 300 MG capsule Commonly known as:  NEURONTIN Take 1 capsule (300 mg total) by mouth 2 (two) times daily.   HYDROcodone-acetaminophen 5-325 MG tablet Commonly known as:  NORCO/VICODIN Take 1 tablet by mouth every 4 (four) hours as needed for moderate pain.   insulin aspart 100 UNIT/ML injection Commonly known as:  novoLOG Inject 10 Units into the skin 3 (three) times daily with meals. What changed:    how much to take  how to take this  when to take this  additional instructions   insulin glargine 100 UNIT/ML injection Commonly known as:  LANTUS Inject 0.52 mLs (52 Units total) into the skin at bedtime. Dispense insulin pen if approved, if not dispense as needed  syringes and needles for 1 month supply. Can switch to Levemir. Diagnosis E 11.65. What changed:  how much to take   Insulin Syringe-Needle U-100 25G X 1" 1 ML Misc For 4 times a day insulin SQ, 1 month supply. Diagnosis E11.65   losartan 50 MG tablet Commonly known as:  COZAAR Take 1 tablet (50 mg total) by mouth daily.   metFORMIN 500 MG tablet Commonly known as:  GLUCOPHAGE Take 1 tablet (500 mg total) by mouth 2 (two) times daily with a meal.   metroNIDAZOLE 500 MG tablet Commonly known as:  FLAGYL Take 1 tablet (500 mg total) by mouth 3 (three) times daily.   polyethylene glycol packet Commonly known as:  MIRALAX / GLYCOLAX Take 17 g by mouth 2 (two) times daily as needed for mild constipation.   sodium phosphate 7-19 GM/118ML Enem Place 133 mLs (1 enema total) rectally once as needed for severe constipation.            Home Infusion Instuctions  (From admission, onward)         Start     Ordered   04/13/18 0000  Home infusion instructions [facility] May follow [place] Dosing Protocol; May administer Cathflo as needed to maintain patency of vascular access device.; Flushing of vascular access device: per [facility] Protocol: 0.9% NaCl pre/post medica...    Question Answer  Comment  Instructions May follow [place] Dosing Protocol   Instructions May administer Cathflo as needed to maintain patency of vascular access device.   Instructions Flushing of vascular access device: per [facility] Protocol: 0.9% NaCl pre/post medication administration and prn patency; Heparin 100 u/ml, 60m for implanted ports and Heparin 10u/ml, 559mfor all other central venous catheters.   Instructions May follow [facility] Anaphylaxis Protocol for First Dose Administration in the home: 0.9% NaCl at 25-50 ml/hr to maintain IV access for protocol meds. Epinephrine 0.3 ml IV/IM PRN and Benadryl 25-50 IV/IM PRN s/s of anaphylaxis.   Instructions [facility] Infusion Coordinator (RN) to assist per patient IV care needs in the home PRN.      04/13/18 1215         Follow-up Information    Comer, RoOkey RegalMD Follow up on 04/28/2018.   Specialty:  Infectious Diseases Why:  Appointment with Dr. CoLinus Salmonst 4:00 pm. Please arrive 15 minutes early to register. Kindly call to reschedule if unable to make appointment.  Contact information: [phone]          No Known Allergies   Procedures/Studies:  Procedures:Right temporal craniotomy for drainage of right temporal brain abscessdone on 04/10/2018 by Dr. JeNewman Pies Dg Chest 2 View  Result Date: 03/18/2018 CLINICAL DATA:  Acute onset of seizures. Status post fall. Confusion. EXAM: CHEST - 2 VIEW COMPARISON:  Chest radiograph performed 02/23/2009 FINDINGS: The lungs are well-aerated and clear. There is no evidence of focal opacification, pleural effusion or pneumothorax. The heart is normal in size; the mediastinal contour is within normal limits. No acute osseous abnormalities are seen. IMPRESSION: No acute cardiopulmonary process seen. No displaced rib fractures identified. Electronically Signed   By: JeGarald Balding.D.   On: 03/18/2018 06:59   Ct Head Wo Contrast  Result Date:  03/18/2018 CLINICAL DATA:  Seizure EXAM: CT HEAD WITHOUT CONTRAST TECHNIQUE: Contiguous axial images were obtained from the base of the skull through the vertex without intravenous contrast. COMPARISON:  None. FINDINGS: Brain: No evidence  of acute infarction, hemorrhage, hydrocephalus, extra-axial collection or mass lesion/mass effect. Vascular: Negative for hyperdense vessel Skull: Negative Sinuses/Orbits: Mild mucosal edema paranasal sinus.  Normal orbit Other: None IMPRESSION: Negative CT head Sinus mucosal disease Electronically Signed   By: Franchot Gallo M.D.   On: 03/18/2018 07:23   Ct Head W & Wo Contrast  Result Date: 04/09/2018  [facility] [address], [place], [place] [address] [phone] NEUROIMAGING REPORT STUDY DATE: 04/08/18 PATIENT NAME: PHU RECORD DOB: 05-19-1995 MRN: [id number] ORDERING CLINICIAN: Dr Jaynee Eagles CLINICAL HISTORY: 22 year patient with headache COMPARISON FILMS: MRI Brain w/wo 03/18/2018 EXAM: CT Head w/wo TECHNIQUE: CT scan of the head was obtained utilizing 5 mm axial slices from the skull base to the vertex. CONTRAST:  75 ml iv omnipaque IMAGING SITE: [facility] FINDINGS: Deep brain parenchyma shows a well-circumscribed area of hypodensity in the right anterior temporal lobe which measures 2.2 mm in the AP, 2.2 in the transverse and 1.8 mm in the vertical dimension.  There is faint enhancement after contrast particularly in the posterior margin.  There is mild surrounding cytotoxic edema, but no significant compression of the temporal horn or hydrocephalus.  Rest of the brain parenchyma appears unremarkable.  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  No other structural lesion tumor or infarcts are noted.  The ventricular system appear unremarkable.  Calvarium shows no abnormalities.  There does not appear to be significant bony involvement of the temporal  bone.   Abnormal CT scan  of the brain with and without contrast showing circumscribed hypodensity in the right temporal lobe with faint enhancement likely compatible with subacute brain abscess.  Significant change compared with previous MRI dated 03/18/2018. INTERPRETING PHYSICIAN: Antony Contras, MD Certified in  Neuroimaging by [place] [employer] and [employer]   Mr Brain W And Wo Contrast  Result Date: 03/18/2018 CLINICAL DATA:  23 year old male with possible seizure this morning. Status post bilateral dental extractions on 03/02/2018, teeth numbers 1, 16, 17, and 32. EXAM: MRI HEAD WITHOUT AND WITH CONTRAST TECHNIQUE: Multiplanar, multiecho pulse sequences of the brain and surrounding structures were obtained without and with intravenous contrast. CONTRAST:  10 milliliters Gadavist COMPARISON:  Head CT without contrast 0703 hours today. FINDINGS: Brain: No restricted diffusion to suggest acute infarction. No midline shift, mass effect, evidence of mass lesion, ventriculomegaly, extra-axial collection or acute intracranial hemorrhage. Cervicomedullary junction and pituitary are within normal limits. On thin slice coronal T2 and FLAIR imaging the hippocampal formations and other mesial temporal lobe structures appear symmetric and within normal limits. Pearline Cables and white matter signal is within normal limits throughout the brain. No chronic cerebral blood products. No abnormal enhancement identified. No dural thickening. Vascular: Major intracranial vascular flow voids are preserved. The major dural venous sinuses are enhancing and appear to be patent. Skull and upper cervical spine: Negative visible cervical spine. Visualized bone marrow signal is within normal limits. Sinuses/Orbits: Negative orbits. Mild to moderate mucosal thickening in the right maxillary sinus. Trace left sphenoid mucosal thickening. Hyperplastic sinuses (normal variant). Other: Mastoids are clear. Visible internal  auditory structures appear normal. There is asymmetric T2 hyperintensity, soft tissue thickening and enhancement in the right medial masticator space most affecting the pterygoid musculature. There is abnormal heterogeneous enhancement. The right TMJ appears spared. The visible right parapharyngeal space appears relatively spared although there is some asymmetric right retro maxillary fat stranding, and associated  abnormal enhancement in the right pterygoid palatine fossa. IMPRESSION: 1. Normal MRI appearance of the brain. 2. Confluent edema and abnormal enhancement in the medial right masticator space with small areas of pterygoid intramuscular necrosis or possibly abscess (series 12, image 4). Perhaps this is related to the recent dental extraction. There is secondary appearing inflammation of the right maxillary sinus Electronically Signed   By: Genevie Ann M.D.   On: 03/18/2018 12:43   [place] Ekg Site Rite  Result Date: 04/12/2018 If Site Rite image not attached, placement could not be confirmed due to current cardiac rhythm.    The results of significant diagnostics from this hospitalization (including imaging, microbiology, ancillary and laboratory) are listed below for reference.     Microbiology: Recent Results (from the past 240 hour(s))  Blood culture (routine x 2)     Status: None   Collection Time: 04/09/18  5:14 PM  Result Value Ref Range Status   Specimen Description BLOOD RIGHT ANTECUBITAL  Final   Special Requests   Final    BOTTLES DRAWN AEROBIC AND ANAEROBIC Blood Culture results may not be optimal due to an excessive volume of blood received in culture bottles   Culture   Final    NO GROWTH 5 DAYS Performed at [facility], [place] [address], [place], [place] [address]    Report Status 04/14/2018 FINAL  Final  Blood culture (routine x 2)     Status: None   Collection Time: 04/09/18  5:14 PM  Result Value Ref Range Status   Specimen Description BLOOD RIGHT HAND  Final    Special Requests   Final    BOTTLES DRAWN AEROBIC AND ANAEROBIC Blood Culture adequate volume   Culture   Final    NO GROWTH 5 DAYS Performed at [facility], [place] [address] [place], [place] [address]    Report Status 04/14/2018 FINAL  Final  Surgical pcr screen     Status: None   Collection Time: 04/10/18  9:00 AM  Result Value Ref Range Status   MRSA, PCR NEGATIVE NEGATIVE Final   Staphylococcus aureus NEGATIVE NEGATIVE Final    Comment: (NOTE) The Xpert SA Assay (FDA approved for NASAL specimens in patients 54 years of age and older), is one component of a comprehensive surveillance program. It is not intended to diagnose infection nor to guide or monitor treatment. Performed at [facility], [place] [address], [place], [place] [address]   Aerobic/Anaerobic Culture (surgical/deep wound)     Status: None (Preliminary result)   Collection Time: 04/10/18 11:42 AM  Result Value Ref Range Status   Specimen Description ABSCESS  Final   Special Requests RIGHT BRAIN  Final   Gram Stain   Final    RARE WBC PRESENT, PREDOMINANTLY MONONUCLEAR NO ORGANISMS SEEN    Culture   Final    NO GROWTH 3 DAYS NO ANAEROBES ISOLATED; CULTURE IN PROGRESS FOR 5 DAYS Performed at [facility], [place] [address], [place], [place] [address]    Report Status PENDING  Incomplete  MRSA PCR Screening     Status: None   Collection Time: 04/10/18  2:01 PM  Result Value Ref Range Status   MRSA by PCR NEGATIVE NEGATIVE Final    Comment:        The GeneXpert MRSA Assay (FDA approved for NASAL specimens only), is one component of a comprehensive MRSA colonization surveillance program. It is not intended to diagnose MRSA infection nor to guide  or monitor treatment for MRSA infections. Performed at [facility], [place] [address] [place], [place] [address]      Labs: BNP (last 3 results) No results for input(s): BNP in the last 8760 hours. Basic Metabolic Panel: Recent Labs  Lab  04/09/18 1715 04/10/18 0747 04/10/18 0916 04/11/18 0647 04/12/18 0541  NA 125* 136  --  133* 134*  K 4.1 3.4*  --  3.7 4.2  CL 89* 95*  --  96* 95*  CO2 23 28  --  25 28  GLUCOSE 512* 191*  --  249* 284*  BUN 9 5*  --  8 11  CREATININE 0.88 0.71  --  1.04 0.98  CALCIUM 9.4 9.4  --  9.1 8.9  MG  --   --  1.9 1.9 2.0  PHOS  --   --  3.5 4.6 3.2   Liver Function Tests: Recent Labs  Lab 04/09/18 1715 04/10/18 0916 04/11/18 0647 04/12/18 0541  AST 11* 10* 13* 10*  ALT '10 10 10 9  '$ ALKPHOS 80 60 68 58  BILITOT 0.8 0.8 0.7 0.6  PROT 7.1 6.9 7.1 6.4*  ALBUMIN 3.8 3.3* 3.5 3.0*   No results for input(s): LIPASE, AMYLASE in the last 168 hours. No results for input(s): AMMONIA in the last 168 hours. CBC: Recent Labs  Lab 04/09/18 1715 04/10/18 0916 04/11/18 0647 04/12/18 0541  WBC 16.2* 13.1* 18.0* 12.7*  NEUTROABS 13.6* 10.6* 15.1* 9.7*  HGB 15.0 15.2 15.4 13.8  HCT 45.8 44.3 46.6 41.5  MCV 84.5 84.1 86.1 87.9  PLT 364 381 350 302   Cardiac Enzymes: No results for input(s): CKTOTAL, CKMB, CKMBINDEX, TROPONINI in the last 168 hours. BNP: Invalid input(s): POCBNP CBG: Recent Labs  Lab 04/13/18 1649 04/13/18 2117 04/13/18 2250 04/14/18 0201 04/14/18 0703  GLUCAP 132* 406* 484* 326* 259*   D-Dimer No results for input(s): DDIMER in the last 72 hours. Hgb A1c No results for input(s): HGBA1C in the last 72 hours. Lipid Profile No results for input(s): CHOL, HDL, LDLCALC, TRIG, CHOLHDL, LDLDIRECT in the last 72 hours. Thyroid function studies No results for input(s): TSH, T4TOTAL, T3FREE, THYROIDAB in the last 72 hours.  Invalid input(s): FREET3 Anemia work up No results for input(s): VITAMINB12, FOLATE, FERRITIN, TIBC, IRON, RETICCTPCT in the last 72 hours. Urinalysis    Component Value Date/Time   COLORURINE STRAW (A) 03/18/2018 0859   APPEARANCEUR CLEAR 03/18/2018 0859   LABSPEC 1.025 03/18/2018 0859   PHURINE 5.0 03/18/2018 0859   GLUCOSEU >=500 (A)  03/18/2018 0859   HGBUR NEGATIVE 03/18/2018 0859   BILIRUBINUR NEGATIVE 03/18/2018 0859   KETONESUR 20 (A) 03/18/2018 0859   PROTEINUR NEGATIVE 03/18/2018 0859   UROBILINOGEN 0.2 01/14/2007 1021   NITRITE NEGATIVE 03/18/2018 0859   LEUKOCYTESUR NEGATIVE 03/18/2018 0859   Sepsis Labs Invalid input(s): PROCALCITONIN,  WBC,  LACTICIDVEN Microbiology Recent Results (from the past 240 hour(s))  Blood culture (routine x 2)     Status: None   Collection Time: 04/09/18  5:14 PM  Result Value Ref Range Status   Specimen Description BLOOD RIGHT ANTECUBITAL  Final   Special Requests   Final    BOTTLES DRAWN AEROBIC AND ANAEROBIC Blood Culture results may not be optimal due to an excessive volume of blood received in culture bottles   Culture   Final    NO GROWTH 5 DAYS Performed at [facility], [place] [address], [place], [place] [address]    Report Status  04/14/2018 FINAL  Final  Blood culture (routine x 2)     Status: None   Collection Time: 04/09/18  5:14 PM  Result Value Ref Range Status   Specimen Description BLOOD RIGHT HAND  Final   Special Requests   Final    BOTTLES DRAWN AEROBIC AND ANAEROBIC Blood Culture adequate volume   Culture   Final    NO GROWTH 5 DAYS Performed at [facility], [address] [place], [place] [address]    Report Status 04/14/2018 FINAL  Final  Surgical pcr screen     Status: None   Collection Time: 04/10/18  9:00 AM  Result Value Ref Range Status   MRSA, PCR NEGATIVE NEGATIVE Final   Staphylococcus aureus NEGATIVE NEGATIVE Final    Comment: (NOTE) The Xpert SA Assay (FDA approved for NASAL specimens in patients 68 years of age and older), is one component of a comprehensive surveillance program. It is not intended to diagnose infection nor to guide or monitor treatment. Performed at [facility], [place] [address] [place], [place] [address]   Aerobic/Anaerobic Culture (surgical/deep wound)     Status: None (Preliminary result)    Collection Time: 04/10/18 11:42 AM  Result Value Ref Range Status   Specimen Description ABSCESS  Final   Special Requests RIGHT BRAIN  Final   Gram Stain   Final    RARE WBC PRESENT, PREDOMINANTLY MONONUCLEAR NO ORGANISMS SEEN    Culture   Final    NO GROWTH 3 DAYS NO ANAEROBES ISOLATED; CULTURE IN PROGRESS FOR 5 DAYS Performed at [facility], [place] [address], [place], [place] [address]    Report Status PENDING  Incomplete  MRSA PCR Screening     Status: None   Collection Time: 04/10/18  2:01 PM  Result Value Ref Range Status   MRSA by PCR NEGATIVE NEGATIVE Final    Comment:        The GeneXpert MRSA Assay (FDA approved for NASAL specimens only), is one component of a comprehensive MRSA colonization surveillance program. It is not intended to diagnose MRSA infection nor to guide or monitor treatment for MRSA infections. Performed at [facility], [place] [address] [place], [place] [address]      Time coordinating discharge in minutes: 65  SIGNED:   Debbe Odea, MD  [facility] 04/14/2018, 10:50 AM Pager   If 7PM-7AM, please contact night-coverage [email or web address] Password [email or web address]

## 2018-04-14 DIAGNOSIS — E1169 Type 2 diabetes mellitus with other specified complication: Secondary | ICD-10-CM

## 2018-04-14 DIAGNOSIS — Z9114 Patient's other noncompliance with medication regimen: Secondary | ICD-10-CM

## 2018-04-14 DIAGNOSIS — D72829 Elevated white blood cell count, unspecified: Secondary | ICD-10-CM

## 2018-04-14 LAB — CULTURE, BLOOD (ROUTINE X 2)
Culture: NO GROWTH
Culture: NO GROWTH
Special Requests: ADEQUATE

## 2018-04-14 LAB — GLUCOSE, CAPILLARY
Glucose-Capillary: 326 mg/dL — ABNORMAL HIGH (ref 70–99)
Glucose-Capillary: 259 mg/dL — ABNORMAL HIGH (ref 70–99)
Glucose-Capillary: 277 mg/dL — ABNORMAL HIGH (ref 70–99)

## 2018-04-14 MED ORDER — SODIUM CHLORIDE 0.9% FLUSH: 10.0000 mL | INTRAVENOUS | Status: DC | PRN

## 2018-04-14 MED ORDER — INSULIN GLARGINE 100 UNIT/ML ~~LOC~~ SOLN: 52.0000 [IU] | Freq: Every day | SUBCUTANEOUS | 0 refills | Status: AC

## 2018-04-14 MED ORDER — INSULIN ASPART 100 UNIT/ML ~~LOC~~ SOLN: 10.0000 [IU] | Freq: Three times a day (TID) | SUBCUTANEOUS | 11 refills | Status: AC

## 2018-04-14 NOTE — Progress Notes (Signed)
Peripherally Inserted Central Catheter/Midline Placement  The IV Nurse has discussed with the patient and/or persons authorized to consent for the patient, the purpose of this procedure and the potential benefits and risks involved with this procedure.  The benefits include less needle sticks, lab draws from the catheter, and the patient may be discharged home with the catheter. Risks include, but not limited to, infection, bleeding, blood clot (thrombus formation), and puncture of an artery; nerve damage and irregular heartbeat and possibility to perform a PICC exchange if needed/ordered by physician.  Alternatives to this procedure were also discussed.  Bard Power PICC patient education guide, fact sheet on infection prevention and patient information card has been provided to patient /or left at bedside.    PICC/Midline Placement Documentation  PICC Single Lumen 04/14/18 PICC Right Basilic 45 cm 0 cm (Active)  Indication for Insertion or Continuance of Line Home intravenous therapies (PICC only) 04/14/2018 10:00 AM  Exposed Catheter (cm) 0 cm 04/14/2018 10:00 AM  Site Assessment Clean;Dry;Intact 04/14/2018 10:00 AM  Line Status Flushed;Blood return noted;Saline locked 04/14/2018 10:00 AM  Dressing Type Transparent;Securing device 04/14/2018 10:00 AM  Dressing Status Clean;Dry;Intact;Antimicrobial disc in place 04/14/2018 10:00 AM  Dressing Change Due 04/21/18 04/14/2018 10:00 AM       Romie Jumper 04/14/2018, 10:13 AM

## 2018-04-14 NOTE — Progress Notes (Signed)
At bedside to get consent for PICC placement from mother . Mother requesting for patient to have some medication for anxiety before placing PICC . Spoke with  Dr. Butler Denmark per mothers request for medication for anxiety and MD refusing at this time and requesting for [place] to place PICC without medication .

## 2018-04-14 NOTE — Progress Notes (Signed)
Subjective: The patient is alert and pleasant.  His mother is at bedside.  He is asking for more Ensure.  I encouraged the patient, and his mother, to have better dietary habits as his diabetes is out of control.  He has been working with the diabetes educator.  Objective: Vital signs in last 24 hours: Temp:  [97.9 F (36.6 C)-98.9 F (37.2 C)] 97.9 F (36.6 C) (01/21 2339) Pulse Rate:  [93-102] 93 (01/22 0409) Resp:  [13-18] 13 (01/22 0409) BP: (121-140)/(49-106) 122/106 (01/22 0409) SpO2:  [97 %-100 %] 100 % (01/22 0409) Estimated body mass index is 32.45 kg/m as calculated from the following:   Height as of this encounter: 6\' 8"  (2.032 m).   Weight as of this encounter: 134 kg.   Intake/Output from previous day: 01/21 0701 - 01/22 0700 In: 180 [P.O.:180] Out: -  Intake/Output this shift: No intake/output data recorded.  Physical exam the patient is alert and pleasant.  His speech and strength is normal.  His wound is healing well.  Lab Results: Recent Labs    04/12/18 0541  WBC 12.7*  HGB 13.8  HCT 41.5  PLT 302   BMET Recent Labs    04/12/18 0541  NA 134*  K 4.2  CL 95*  CO2 28  GLUCOSE 284*  BUN 11  CREATININE 0.98  CALCIUM 8.9    Studies/Results: [place] Ekg Site Rite  Result Date: 04/12/2018 If Site Rite image not attached, placement could not be confirmed due to current cardiac rhythm.   Assessment/Plan: Postop day #4: The patient is doing well.  He is going to have a PICC line placed today.  He will need long-term antibiotics at the direction of ID.  I have answered all the patient's mother's questions again.  LOS: 4 days     Cristi Loron 04/14/2018, 8:00 AM

## 2018-04-14 NOTE — Progress Notes (Signed)
Patient requested to take a shower. After shower this RN tried to apply centralized telemetry to patient and he stated he didn't want to wear it anymore. He stated the telemetry box 'was too heavy.' I explained the reasoning behind telemetry and his mother said, 'but there's nothing wrong with his heart.' Once again tried to educate both patient and mother why the telemetry was ordered and needed. Patient once again stated, 'I'm not wearing that. I don't want it on my chest again.' Schorr, NP notified and patient class along with cardiac monitoring orders requested to be changed. CCMD notified. Will continue to closely monitor.

## 2018-04-15 LAB — AEROBIC/ANAEROBIC CULTURE W GRAM STAIN (SURGICAL/DEEP WOUND): Culture: NO GROWTH

## 2018-04-15 LAB — AEROBIC/ANAEROBIC CULTURE (SURGICAL/DEEP WOUND)

## 2018-04-28 ENCOUNTER — Inpatient Hospital Stay: Payer: Medicaid Other | Admitting: Internal Medicine

## 2018-04-28 NOTE — Discharge Summary (Signed)
Physician Discharge Summary  Mason Pratt LYY:[id number] DOB: Nov 11, 1995 DOA: 04/09/2018  PCP: System, Provider Not In  Admit date: 04/09/2018 Discharge date: 04/28/2018  Admitted From: home Disposition:  home   Recommendations for Outpatient Follow-up:  1. F/u glucose control  Home Health:  ordered  Equipment/Devices:  PICC line    Discharge Condition:  stable   CODE STATUS:  Full code   Consultations:  NS  ID    Discharge Diagnoses:  Principal Problem:   Brain abscess Active Problems:   DM (diabetes mellitus), type 2 - uncontrolled Seizure disorder   Hyponatremia   Dental abscess   Essential hypertension   pseudohyponatremia  Obesity      Brief Summary: Mason Pratt  is a 23 y.o.malewith medical history significant ofrecently onset of seizures (last month), uncontrolled DM, developmental delay,  with recent extraction of his teeth who was on antibiotics comes in with a CT scan which shows a brain abscess . The CT was ordered by neurology as an outpatient for seizures.  Noted to be hyperglycemic with CBG in 500s in the ED. [facility] asked to admit.   Recently admitted from 03/18/18-03/19/18 and placed on Depakote after a neurology eval. A head CT and MRI of the brain did not show an abscess at that time.  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 ID was consulted to assist with management an evaluated the patient on 04/11/18.    Hospital Course:   Brain abscess- suspected to have started as a dental abscess - appreciate ID eval--s/p craniotomy as mentioned above- quite stable -   Abscess culture negative - discharge with Ceftriaxone and oral Flagyl for 6 wks per ID - - PICC has been placed today without difficulty  Active Problems:   Seizures - cont Depakote    DM (diabetes mellitus), type 2 - uncontrolled  With pseudohyponatermia - unfortunately the patient does not adhere to a diabetic diet  - A1c 14.9 on  04/10/18 - home doses of Lantus and Novolog have been increased and he and his mother have been extensively educated - cont Metfromin    Essential hypertension - cont Losartan   Obesity   Body mass index is 32.45 kg/m.  Developmental delay    Discharge Exam: Vitals:   04/14/18 1157 04/14/18 1545  BP: (!) 144/100 103/60  Pulse: 97 97  Resp: 18 18  Temp:  98.1 F (36.7 C)  SpO2: 100% 100%   Vitals:   04/14/18 0409 04/14/18 0802 04/14/18 1157 04/14/18 1545  BP: (!) 122/106 126/79 (!) 144/100 103/60  Pulse: 93 83 97 97  Resp: '13 16 18 18  '$ Temp:  (!) 97.5 F (36.4 C)  98.1 F (36.7 C)  TempSrc:  Axillary  Oral  SpO2: 100% 100% 100% 100%  Weight:      Height:        General: Pt is alert, awake, not in acute distress Cardiovascular: RRR, S1/S2 +, no rubs, no gallops Respiratory: CTA bilaterally, no wheezing, no rhonchi Abdominal: Soft, NT, ND, bowel sounds + Extremities: no edema, no cyanosis   Discharge Instructions  Discharge Instructions    Diet - low sodium heart healthy   Complete by:  As directed    Diet Carb Modified   Complete by:  As directed    Home infusion instructions [facility] May follow [place] Dosing Protocol; May administer Cathflo as needed to maintain patency of vascular access device.; Flushing of vascular access  device: per [facility] Protocol: 0.9% NaCl pre/post medica...   Complete by:  As directed    Instructions:  May follow [place] Dosing Protocol   Instructions:  May administer Cathflo as needed to maintain patency of vascular access device.   Instructions:  Flushing of vascular access device: per [facility] Protocol: 0.9% NaCl pre/post medication administration and prn patency; Heparin 100 u/ml, 94m for implanted ports and Heparin 10u/ml, 557mfor all other central venous catheters.   Instructions:  May follow [facility] Anaphylaxis Protocol for First Dose Administration in the home: 0.9% NaCl at 25-50 ml/hr to maintain IV access for  protocol meds. Epinephrine 0.3 ml IV/IM PRN and Benadryl 25-50 IV/IM PRN s/s of anaphylaxis.   Instructions:  [facility] Coordinator (RN) to assist per patient IV care needs in the home PRN.   Increase activity slowly   Complete by:  As directed      Allergies as of 04/14/2018   No Known Allergies     Medication List    STOP taking these medications   ibuprofen 800 MG tablet Commonly known as:  ADVIL,MOTRIN   oxyCODONE-acetaminophen 5-325 MG tablet Commonly known as:  PERCOCET     TAKE these medications   bisacodyl 10 MG suppository Commonly known as:  DULCOLAX Place 1 suppository (10 mg total) rectally daily as needed for moderate constipation.   blood glucose meter kit and supplies Kit Dispense based on patient and insurance preference. Use up to four times daily as directed. (FOR ICD-9 250.00, 250.01). For QAC - HS accuchecks.   cefTRIAXone  IVPB Commonly known as:  ROCEPHIN Inject 2 g into the vein every 12 (twelve) hours. Indication:  Brain abscess Last Day of Therapy:  05/21/2018 Labs - Once weekly:  CBC/D and BMP, Labs - Every other week:  ESR and CRP   divalproex 500 MG 24 hr tablet Commonly known as:  DEPAKOTE ER Take 2 tablets (1,000 mg total) by mouth daily.   docusate sodium 100 MG capsule Commonly known as:  COLACE Take 1 capsule (100 mg total) by mouth 2 (two) times daily.   gabapentin 300 MG capsule Commonly known as:  NEURONTIN Take 1 capsule (300 mg total) by mouth 2 (two) times daily.   HYDROcodone-acetaminophen 5-325 MG tablet Commonly known as:  NORCO/VICODIN Take 1 tablet by mouth every 4 (four) hours as needed for moderate pain.   insulin aspart 100 UNIT/ML injection Commonly known as:  novoLOG Inject 10 Units into the skin 3 (three) times daily with meals. What changed:    how much to take  how to take this  when to take this  additional instructions   insulin glargine 100 UNIT/ML injection Commonly known as:   LANTUS Inject 0.52 mLs (52 Units total) into the skin at bedtime. Dispense insulin pen if approved, if not dispense as needed syringes and needles for 1 month supply. Can switch to Levemir. Diagnosis E 11.65. What changed:  how much to take   Insulin Syringe-Needle U-100 25G X 1" 1 ML Misc For 4 times a day insulin SQ, 1 month supply. Diagnosis E11.65   losartan 50 MG tablet Commonly known as:  COZAAR Take 1 tablet (50 mg total) by mouth daily.   metFORMIN 500 MG tablet Commonly known as:  GLUCOPHAGE Take 1 tablet (500 mg total) by mouth 2 (two) times daily with a meal.   metroNIDAZOLE 500 MG tablet Commonly known as:  FLAGYL Take 1 tablet (500 mg total) by mouth 3 (three)  times daily.   polyethylene glycol packet Commonly known as:  MIRALAX / GLYCOLAX Take 17 g by mouth 2 (two) times daily as needed for mild constipation.   sodium phosphate 7-19 GM/118ML Enem Place 133 mLs (1 enema total) rectally once as needed for severe constipation.            Home Infusion Instuctions  (From admission, onward)         Start     Ordered   04/13/18 0000  Home infusion instructions [facility] May follow [place] Dosing Protocol; May administer Cathflo as needed to maintain patency of vascular access device.; Flushing of vascular access device: per [facility] Protocol: 0.9% NaCl pre/post medica...    Question Answer Comment  Instructions May follow [place] Dosing Protocol   Instructions May administer Cathflo as needed to maintain patency of vascular access device.   Instructions Flushing of vascular access device: per [facility] Protocol: 0.9% NaCl pre/post medication administration and prn patency; Heparin 100 u/ml, 77m for implanted ports and Heparin 10u/ml, 530mfor all other central venous catheters.   Instructions May follow [facility] Anaphylaxis Protocol for First Dose Administration in the home: 0.9% NaCl at 25-50 ml/hr to maintain IV access for protocol meds. Epinephrine 0.3 ml IV/IM  PRN and Benadryl 25-50 IV/IM PRN s/s of anaphylaxis.   Instructions [facility] Infusion Coordinator (RN) to assist per patient IV care needs in the home PRN.      04/13/18 1215         Follow-up Information    Comer, RoOkey RegalMD Follow up on 04/28/2018.   Specialty:  Infectious Diseases Why:  Appointment with Dr. CoLinus Salmonst 4:00 pm. Please arrive 15 minutes early to register. Kindly call to reschedule if unable to make appointment.  Contact information: [phone]          No Known Allergies   Procedures/Studies:    Ct Head W & Wo Contrast  Result Date: 04/09/2018  [facility] [address] [phone] REPORT STUDY DATE: 04/08/18 PATIENT NAME: ReCRYSTAL ELLWOODOB: [birth year]RN: [id number] CLINICIAN: Dr AhJaynee EaglesLINICAL HISTORY: 22 year patient with headache COMPARISON FILMS: MRI Brain w/wo 03/18/2018 EXAM: CT Head w/wo TECHNIQUE: CT scan of the head was obtained utilizing 5 mm axial slices from the skull base to the vertex. CONTRAST:  75 ml iv omnipaque IMAGING SITE: [facility] FINDINGS: Deep brain parenchyma shows a well-circumscribed area of hypodensity in the right anterior temporal lobe which measures 2.2 mm in the AP, 2.2 in the transverse and 1.8 mm in the vertical dimension.  There is faint enhancement after contrast particularly in the posterior margin.  There is mild surrounding cytotoxic edema, but no significant compression of the temporal horn or hydrocephalus.  Rest of the brain parenchyma appears unremarkable.  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  No other structural lesion tumor or infarcts are noted.  The ventricular system appear unremarkable.  Calvarium shows no abnormalities.  There does not appear to be significant bony involvement of the temporal  bone.   Abnormal CT  scan of the brain with and without contrast showing circumscribed hypodensity in the right temporal lobe with faint enhancement likely compatible with subacute brain abscess.  Significant change compared with previous MRI dated 03/18/2018. INTERPRETING PHYSICIAN: PRAntony ContrasMD Certified in  Neuroimaging by [employer] and [employer]   [place] Ekg Site Rite  Result Date: 04/12/2018 If Site Rite image not attached, placement could not be confirmed due to current cardiac rhythm.     The results of significant diagnostics from this hospitalization (including imaging, microbiology, ancillary and laboratory) are listed below for reference.     Microbiology: No results found for this or any previous visit (from the past 240 hour(s)).   Labs: BNP (last 3 results) No results for input(s): BNP in the last 8760 hours. Basic Metabolic Panel: No results for input(s): NA, K, CL, CO2, GLUCOSE, BUN, CREATININE, CALCIUM, MG, PHOS in the last 168 hours. Liver Function Tests: No results for input(s): AST, ALT, ALKPHOS, BILITOT, PROT, ALBUMIN in the last 168 hours. No results for input(s): LIPASE, AMYLASE in the last 168 hours. No results for input(s): AMMONIA in the last 168 hours. CBC: No results for input(s): WBC, NEUTROABS, HGB, HCT, MCV, PLT in the last 168 hours. Cardiac Enzymes: No results for input(s): CKTOTAL, CKMB, CKMBINDEX, TROPONINI in the last 168 hours. BNP: Invalid input(s): POCBNP CBG: No results for input(s): GLUCAP in the last 168 hours. D-Dimer No results for input(s): DDIMER in the last 72 hours. Hgb A1c No results for input(s): HGBA1C in the last 72 hours. Lipid Profile No results for input(s): CHOL, HDL, LDLCALC, TRIG, CHOLHDL, LDLDIRECT in the last 72 hours. Thyroid function studies No results for input(s): TSH, T4TOTAL, T3FREE, THYROIDAB in the last 72 hours.  Invalid input(s): FREET3 Anemia work up No results for  input(s): VITAMINB12, FOLATE, FERRITIN, TIBC, IRON, RETICCTPCT in the last 72 hours. Urinalysis    Component Value Date/Time   COLORURINE STRAW (A) 03/18/2018 0859   APPEARANCEUR CLEAR 03/18/2018 0859   LABSPEC 1.025 03/18/2018 0859   PHURINE 5.0 03/18/2018 0859   GLUCOSEU >=500 (A) 03/18/2018 0859   HGBUR NEGATIVE 03/18/2018 0859   BILIRUBINUR NEGATIVE 03/18/2018 0859   KETONESUR 20 (A) 03/18/2018 0859   PROTEINUR NEGATIVE 03/18/2018 0859   UROBILINOGEN 0.2 01/14/2007 1021   NITRITE NEGATIVE 03/18/2018 0859   LEUKOCYTESUR NEGATIVE 03/18/2018 0859   Sepsis Labs Invalid input(s): PROCALCITONIN,  WBC,  LACTICIDVEN Microbiology No results found for this or any previous visit (from the past 240 hour(s)).   Time coordinating discharge in minutes: 65  SIGNED:   Debbe Odea, MD  [facility] 04/28/2018, 2:07 PM Pager   If 7PM-7AM, please contact night-coverage [email or web address] Password [email or web address]

## 2018-05-05 ENCOUNTER — Encounter: Payer: Self-pay | Admitting: Internal Medicine

## 2018-05-05 ENCOUNTER — Ambulatory Visit (INDEPENDENT_AMBULATORY_CARE_PROVIDER_SITE_OTHER): Payer: Medicaid Other | Admitting: Internal Medicine

## 2018-05-05 VITALS — BP 140/100 | HR 97 | Temp 98.4°F | Wt 315.0 lb

## 2018-05-05 DIAGNOSIS — G060 Intracranial abscess and granuloma: Secondary | ICD-10-CM

## 2018-05-05 DIAGNOSIS — I10 Essential (primary) hypertension: Secondary | ICD-10-CM | POA: Diagnosis not present

## 2018-05-05 DIAGNOSIS — Z452 Encounter for adjustment and management of vascular access device: Secondary | ICD-10-CM | POA: Diagnosis not present

## 2018-05-05 NOTE — Assessment & Plan Note (Signed)
No issues.  Will change his covering today in clinic.  This will be removed after treatment completion on February 28th by Home Health - I discussed this with Corrie DandyMary at [facility].

## 2018-05-05 NOTE — Assessment & Plan Note (Signed)
Elevated.  The mother is going to get him to her PCP

## 2018-05-05 NOTE — Progress Notes (Signed)
   Subjective:    Patient ID: Mason Pratt, male    DOB: 22-Feb-1996, 23 y.o.   MRN: [id number]  HPI Here for hsfu. Had a brain abscess following a tooth extraction and remained culture negative. Taken to the OR by Dr. Lovell Sheehan on 04/10/2018 and debrided.  He has been on ceftriaxone and oral flagyl and no issues.  No associated n/v/d.  Here with his mother.  No missed doses.    Review of Systems  Constitutional: Negative for chills and fever.  Gastrointestinal: Negative for diarrhea and nausea.  Skin: Negative for rash.       Objective:   Physical Exam Constitutional:      Appearance: Normal appearance.  HENT:     Head: Normocephalic.     Comments: Incision closed, clean, no surrounding erythema. Eyes:     General: No scleral icterus. Cardiovascular:     Rate and Rhythm: Normal rate and regular rhythm.     Heart sounds: No murmur.  Pulmonary:     Effort: Pulmonary effort is normal.     Breath sounds: Normal breath sounds.  Skin:    Findings: No rash.  Neurological:     Mental Status: He is alert.   SH: no tobacco        Assessment & Plan:

## 2018-05-05 NOTE — Assessment & Plan Note (Signed)
He is doing well with his medication and tolerating.  Really seems well improved, incision closed.   He will complete 6 weeks and will stop, observe off of antibiotics

## 2018-05-13 ENCOUNTER — Telehealth: Payer: Self-pay | Admitting: Neurology

## 2018-05-13 NOTE — Telephone Encounter (Signed)
I called pt's mother back and LVM asking for call back asap. Advised office closing at noon. Left office number in message.

## 2018-05-13 NOTE — Telephone Encounter (Signed)
Pt's mother Ginger/dpr called wanting to know if the pt should be seen here or would like to be advised on what to do for the pt since he is still suffering pain do the the Brain Abscess that was operated on. Please advise.

## 2018-05-13 NOTE — Telephone Encounter (Signed)
He has to be seen by neurosurgery, follow up there or if there is new pain or worsening pain he needs to go to the ED to see if the abscess has recurred.

## 2018-05-13 NOTE — Telephone Encounter (Signed)
I spoke with patient's mother and discussed Dr. Trevor Mace recommendation to f/u with neurosurgery. I also noted that pt is still on abx under Dr. Ephriam Knuckles care. I advised she can call him as well to discuss. She wanted to ask for another CT scan so she will reach out to Dr. Lovell Sheehan and Dr. Luciana Axe. .  Her questions were answered. Discussed that for now since pt has had abscess he needs to be following up with those doctors as Dr. Lucia Gaskins doesn't treat abscesses.She is aware that we have advised if this is new or worsening pain that she should take him to the ED to ensure the abscess has not recurred. She verbalized understanding & appreciation for the call.

## 2018-05-21 ENCOUNTER — Other Ambulatory Visit: Payer: Self-pay | Admitting: Student

## 2018-05-21 DIAGNOSIS — G060 Intracranial abscess and granuloma: Secondary | ICD-10-CM

## 2018-05-28 ENCOUNTER — Other Ambulatory Visit: Payer: Medicaid Other

## 2018-05-31 ENCOUNTER — Other Ambulatory Visit: Payer: Medicaid Other

## 2018-06-02 ENCOUNTER — Ambulatory Visit
Admission: RE | Admit: 2018-06-02 | Discharge: 2018-06-02 | Disposition: A | Discharge status: Home / Self Care | Payer: Medicaid Other | Source: Ambulatory Visit | Attending: Student | Admitting: Student

## 2018-06-02 ENCOUNTER — Other Ambulatory Visit: Payer: Self-pay

## 2018-06-02 DIAGNOSIS — G060 Intracranial abscess and granuloma: Secondary | ICD-10-CM

## 2018-06-02 MED ORDER — IOPAMIDOL (ISOVUE-300) INJECTION 61%
75.0000 mL | Freq: Once | INTRAVENOUS | Status: AC | PRN
  Administered 2018-06-02: 75 mL via INTRAVENOUS

## 2018-06-07 ENCOUNTER — Other Ambulatory Visit: Payer: Self-pay | Admitting: Neurosurgery

## 2018-06-07 DIAGNOSIS — G060 Intracranial abscess and granuloma: Secondary | ICD-10-CM

## 2018-06-08 ENCOUNTER — Other Ambulatory Visit: Payer: Medicaid Other

## 2018-06-08 ENCOUNTER — Other Ambulatory Visit: Payer: Self-pay

## 2018-06-08 ENCOUNTER — Ambulatory Visit
Admission: RE | Admit: 2018-06-08 | Discharge: 2018-06-08 | Disposition: A | Discharge status: Home / Self Care | Payer: Medicaid Other | Source: Ambulatory Visit | Attending: Neurosurgery | Admitting: Neurosurgery

## 2018-06-08 DIAGNOSIS — G060 Intracranial abscess and granuloma: Secondary | ICD-10-CM

## 2018-06-08 MED ORDER — GADOBENATE DIMEGLUMINE 529 MG/ML IV SOLN
20.0000 mL | Freq: Once | INTRAVENOUS | Status: AC | PRN
  Administered 2018-06-08: 20 mL via INTRAVENOUS

## 2018-06-29 ENCOUNTER — Other Ambulatory Visit: Payer: Self-pay | Admitting: Neurosurgery

## 2018-06-29 DIAGNOSIS — G060 Intracranial abscess and granuloma: Secondary | ICD-10-CM

## 2018-07-05 ENCOUNTER — Ambulatory Visit
Admission: RE | Admit: 2018-07-05 | Discharge: 2018-07-05 | Disposition: A | Discharge status: Home / Self Care | Payer: Medicaid Other | Source: Ambulatory Visit | Attending: Neurosurgery | Admitting: Neurosurgery

## 2018-07-05 ENCOUNTER — Other Ambulatory Visit: Payer: Self-pay

## 2018-07-05 DIAGNOSIS — G060 Intracranial abscess and granuloma: Secondary | ICD-10-CM

## 2018-07-07 ENCOUNTER — Ambulatory Visit
Admission: RE | Admit: 2018-07-07 | Discharge: 2018-07-07 | Disposition: A | Discharge status: Home / Self Care | Payer: Medicaid Other | Source: Ambulatory Visit | Attending: Neurosurgery | Admitting: Neurosurgery

## 2018-07-07 ENCOUNTER — Other Ambulatory Visit: Payer: Self-pay

## 2019-01-08 ENCOUNTER — Emergency Department (HOSPITAL_COMMUNITY)
Admission: EM | Admit: 2019-01-08 | Discharge: 2019-01-08 | Disposition: A | Discharge status: Home / Self Care | Payer: Medicaid Other | Attending: Emergency Medicine | Admitting: Emergency Medicine

## 2019-01-08 ENCOUNTER — Encounter (HOSPITAL_COMMUNITY): Payer: Self-pay | Admitting: Emergency Medicine

## 2019-01-08 ENCOUNTER — Emergency Department (HOSPITAL_COMMUNITY): Payer: Medicaid Other

## 2019-01-08 DIAGNOSIS — Z79899 Other long term (current) drug therapy: Secondary | ICD-10-CM | POA: Diagnosis not present

## 2019-01-08 DIAGNOSIS — Z794 Long term (current) use of insulin: Secondary | ICD-10-CM | POA: Diagnosis not present

## 2019-01-08 DIAGNOSIS — Y92410 Unspecified street and highway as the place of occurrence of the external cause: Secondary | ICD-10-CM | POA: Diagnosis not present

## 2019-01-08 DIAGNOSIS — S39012A Strain of muscle, fascia and tendon of lower back, initial encounter: Secondary | ICD-10-CM | POA: Diagnosis not present

## 2019-01-08 DIAGNOSIS — S3992XA Unspecified injury of lower back, initial encounter: Secondary | ICD-10-CM | POA: Diagnosis present

## 2019-01-08 DIAGNOSIS — E119 Type 2 diabetes mellitus without complications: Secondary | ICD-10-CM | POA: Insufficient documentation

## 2019-01-08 DIAGNOSIS — I10 Essential (primary) hypertension: Secondary | ICD-10-CM | POA: Diagnosis not present

## 2019-01-08 DIAGNOSIS — Y999 Unspecified external cause status: Secondary | ICD-10-CM | POA: Insufficient documentation

## 2019-01-08 DIAGNOSIS — Y93I9 Activity, other involving external motion: Secondary | ICD-10-CM | POA: Insufficient documentation
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
## 2020-05-19 IMAGING — CT CT HEAD WITHOUT AND WITH CONTRAST
1 of 2 series · 13 of 30 positions shown, 17 images · IV contrast (iopamidol)
Comparison: CT head 04/08/2018. MR head 03/18/2018.

CLINICAL DATA: Brain abscess secondary to dental infection.
Continued surveillance. Headaches.

EXAM:
CT HEAD WITHOUT AND WITH CONTRAST
TECHNIQUE: Contiguous axial images were obtained from the base of the skull
through the vertex without and with intravenous contrast
CONTRAST:  75mL K3GFHJ-7TT IOPAMIDOL (K3GFHJ-7TT) INJECTION 61%

[Series 2: head w/(date) · axial · 0.44mm/px · z∈[-182,-22]mm · 13 of 38 slices shown, 17 images]
[im 3/38  brain]
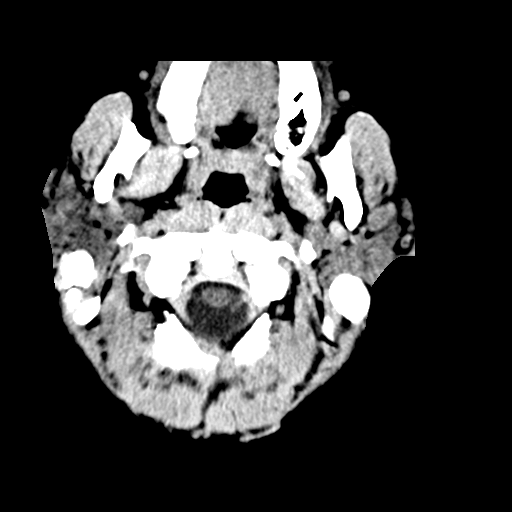
[im 3/38  bone]
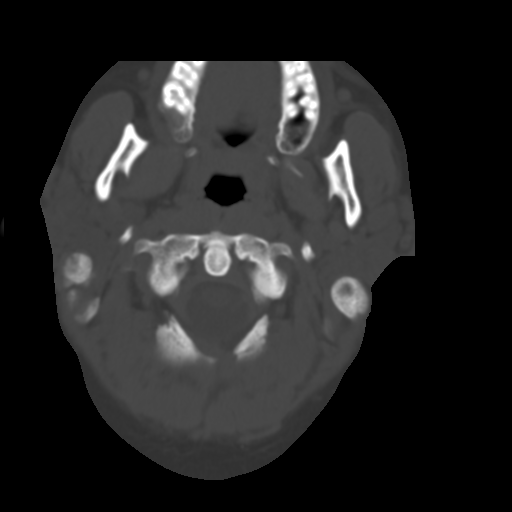
[im 6/38  brain]
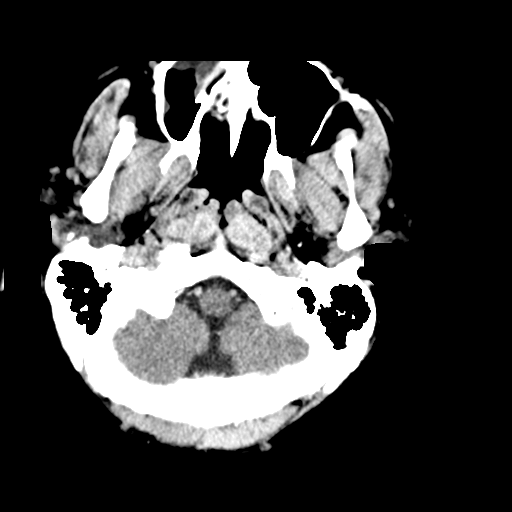
[im 8/38  brain]
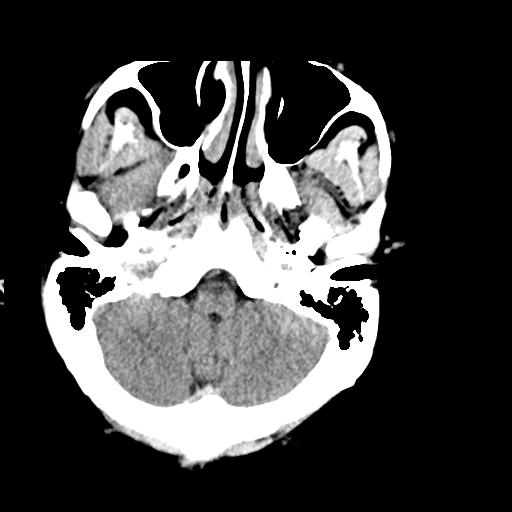
[im 11/38  brain]
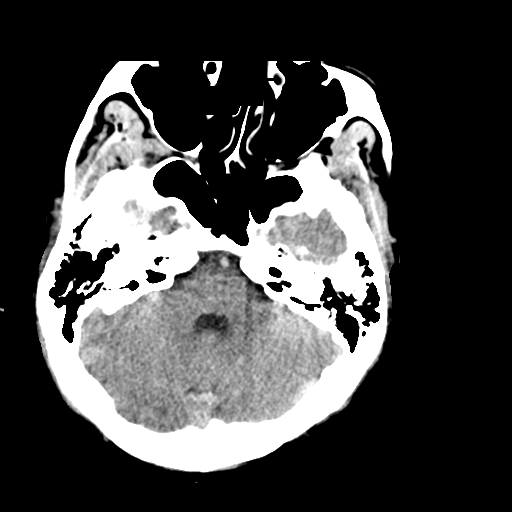
[im 14/38  brain]
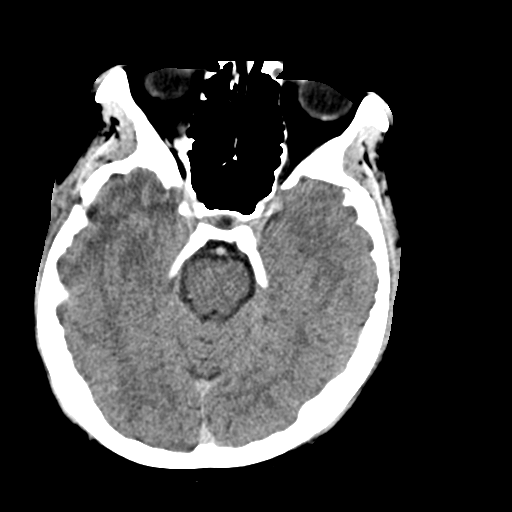
[im 14/38  bone]
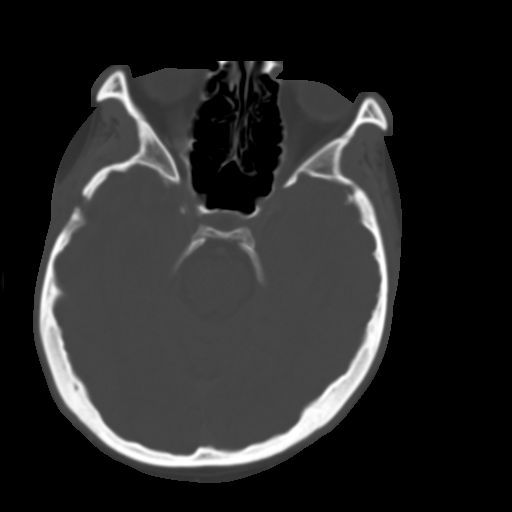
[im 16/38  brain]
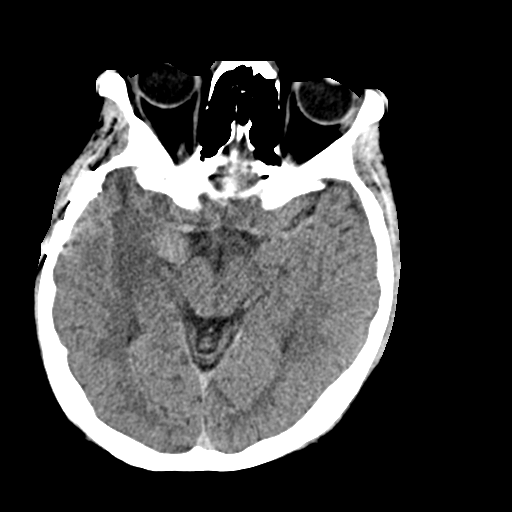
[im 19/38  brain]
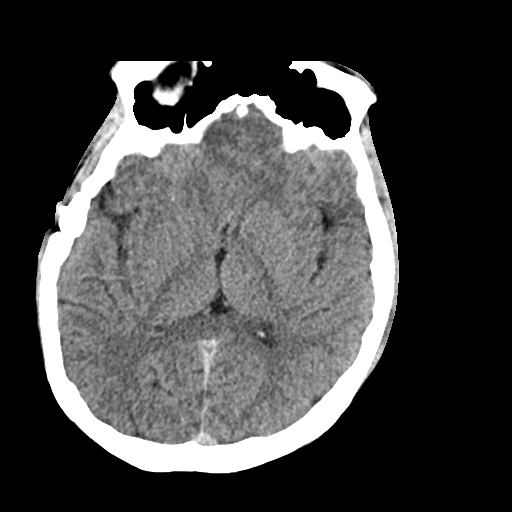
[im 22/38  brain]
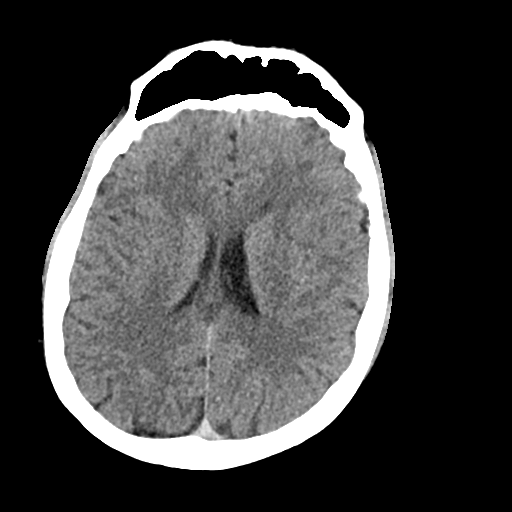
[im 24/38  brain]
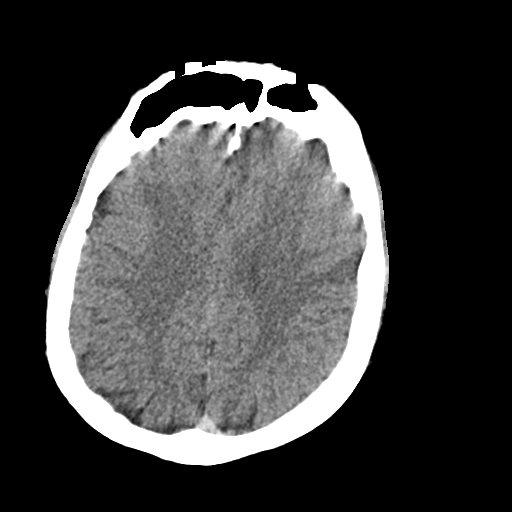
[im 24/38  bone]
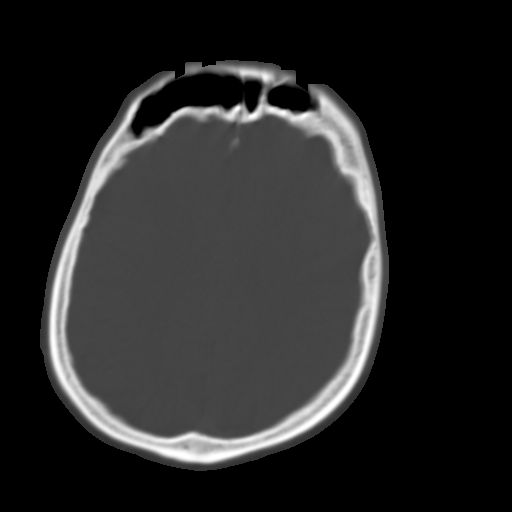
[im 27/38  brain]
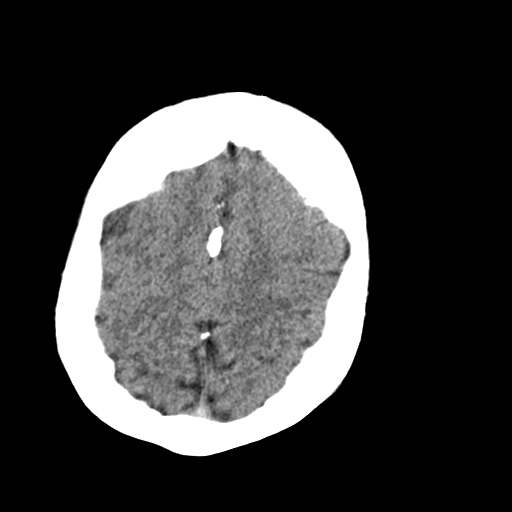
[im 30/38  brain]
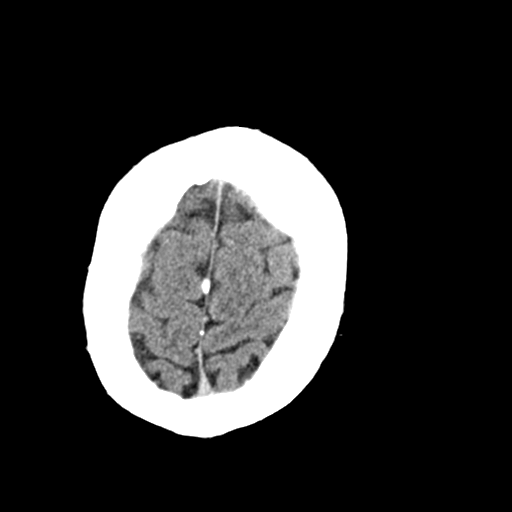
[im 32/38  brain]
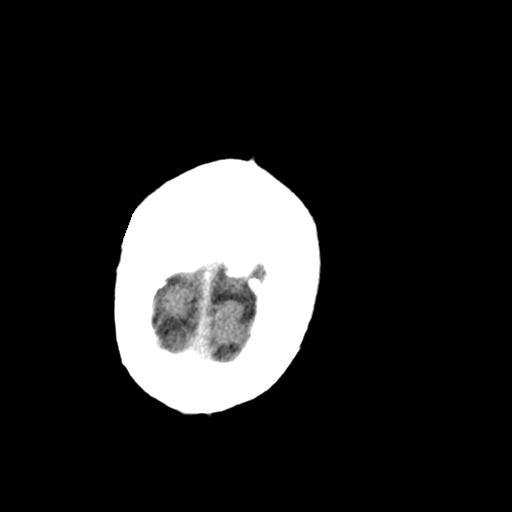
[im 35/38  brain]
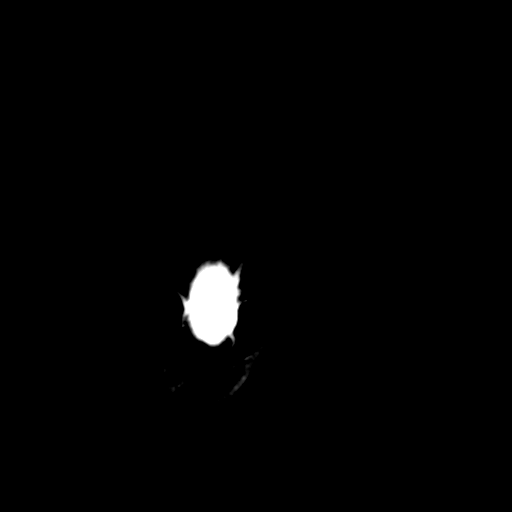
[im 35/38  bone]
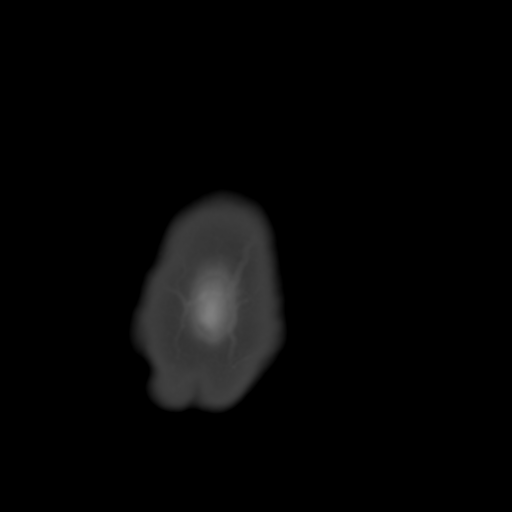

[13 of 30 positions shown; findings below may reference images not displayed]

FINDINGS: Brain: No visible acute stroke or hemorrhage.

Residual vasogenic edema in the RIGHT temporal lobe following
craniotomy 04/10/2018 for brain abscess. Post infusion, there is
abnormal enhancement in the inferior temporal lobe, which extends to
the floor of the middle cranial fossa. Approximate cross-section
measurements are 8 x 11 x 19 mm (R-L x A-P x C-C). Residual brain
infection is not excluded. No midline shift.

On post infusion imaging, the RIGHT uncus is mildly hyperdense
compared to the LEFT. Uncertain significance. Cerebritis not
excluded.

Vascular: No hyperdense vessel or unexpected calcification. Visible
vessels are patent.

Skull: Unremarkable craniotomy defect.

Sinuses/Orbits: No acute finding.

Other: None.
IMPRESSION: Vasogenic edema in the RIGHT temporal lobe 6 weeks post craniotomy
for brain abscess. Residual enhancement in the inferior temporal
lobe as well as the uncus, extending to the floor of the middle
cranial fossa, 8 x 11 x 19 mm. Suspect residual infection which
could be within the brain, subdural, and or epidural spaces. Repeat
imaging with MRI brain, without and with contrast, could be helpful
in further evaluation.

These results will be called to the ordering clinician or
representative by the Radiologist Assistant, and communication
documented in the PACS or zVision Dashboard.

## 2022-06-06 ENCOUNTER — Other Ambulatory Visit: Payer: Self-pay

## 2022-06-06 ENCOUNTER — Emergency Department
Admission: EM | Admit: 2022-06-06 | Discharge: 2022-06-06 | Disposition: A | Discharge status: Home / Self Care | Payer: Medicaid Other | Attending: Emergency Medicine | Admitting: Emergency Medicine

## 2022-06-06 ENCOUNTER — Emergency Department: Payer: Medicaid Other

## 2022-06-06 ENCOUNTER — Encounter: Payer: Self-pay | Admitting: Emergency Medicine

## 2022-06-06 DIAGNOSIS — E119 Type 2 diabetes mellitus without complications: Secondary | ICD-10-CM | POA: Diagnosis not present

## 2022-06-06 DIAGNOSIS — Y9302 Activity, running: Secondary | ICD-10-CM | POA: Insufficient documentation

## 2022-06-06 DIAGNOSIS — S99921A Unspecified injury of right foot, initial encounter: Secondary | ICD-10-CM | POA: Insufficient documentation

## 2022-06-06 DIAGNOSIS — I10 Essential (primary) hypertension: Secondary | ICD-10-CM | POA: Insufficient documentation

## 2022-06-06 DIAGNOSIS — Y92009 Unspecified place in unspecified non-institutional (private) residence as the place of occurrence of the external cause: Secondary | ICD-10-CM | POA: Insufficient documentation

## 2022-06-06 DIAGNOSIS — X509XXA Other and unspecified overexertion or strenuous movements or postures, initial encounter: Secondary | ICD-10-CM | POA: Insufficient documentation

## 2022-06-06 NOTE — ED Notes (Signed)
Pt states yesterday while doing group activities he injured his right foot, pt points to the center of the bottom of his right foot and radiates up to the back of his heal, the area appears swollen and is tender to touch. Pt denies any leg pain and states that he is unaware of a specific injury, pt states that it happened when he took off running and felt something snap

## 2022-06-06 NOTE — ED Triage Notes (Signed)
Patient to ED via [facility] from group home- [facility] for right foot pain. Patient states he was running yesterday when he heard a pop. Hurts to walk on foot.

## 2022-06-06 NOTE — ED Provider Notes (Signed)
[facility] Provider Note    Event Date/Time   First MD Initiated Contact with Patient 06/06/22 1044     (approximate)   History   Foot Injury   HPI  Mason Pratt is a 27 y.o. male who presents today for evaluation of right foot pain.  Patient reports that he was playing with another member of the group home, and he pushed off and felt a sharp pain in the bottom of his foot.  He reports that he has had pain ever since.  He denies numbness or tingling.  He is still able to move his foot and ankle normally.  No fevers or chills.  Patient Active Problem List   Diagnosis Date Noted   PICC (peripherally inserted central catheter) in place 05/05/2018   Brain abscess 04/10/2018   pseudohyponatremia 04/10/2018   Seizures ([place]) 03/18/2018   DM (diabetes mellitus), type 2 ([place]) 03/18/2018   Hyponatremia 03/18/2018   Dental abscess 03/18/2018   Essential hypertension 03/18/2018          Physical Exam   Triage Vital Signs: ED Triage Vitals  Enc Vitals Group     BP 06/06/22 1039 (!) 155/117     Pulse Rate 06/06/22 1039 70     Resp 06/06/22 1039 18     Temp 06/06/22 1039 98.6 F (37 C)     Temp Source 06/06/22 1039 Oral     SpO2 06/06/22 1039 97 %     Weight --      Height --      Head Circumference --      Peak Flow --      Pain Score 06/06/22 1038 10     Pain Loc --      Pain Edu? --      Excl. in [place]? --     Most recent vital signs: Vitals:   06/06/22 1039 06/06/22 1207  BP: (!) 155/117 (!) 148/99  Pulse: 70 78  Resp: 18   Temp: 98.6 F (37 C) 98.1 F (36.7 C)  SpO2: 97% 99%    Physical Exam Vitals and nursing note reviewed.  Constitutional:      General: Awake and alert. No acute distress.    Appearance: Normal appearance. The patient is normal weight.  HENT:     Head: Normocephalic and atraumatic.     Mouth: Mucous membranes are moist.  Eyes:     General: PERRL. Normal EOMs        Right eye: No discharge.        Left eye:  No discharge.     Conjunctiva/sclera: Conjunctivae normal.  Cardiovascular:     Rate and Rhythm: Normal rate and regular rhythm.     Pulses: Normal pulses.     Heart sounds: Normal heart sounds Pulmonary:     Effort: Pulmonary effort is normal. No respiratory distress.     Breath sounds: Normal breath sounds.  Abdominal:     Abdomen is soft. There is no abdominal tenderness. No rebound or guarding. No distention. Musculoskeletal:        General: No swelling. Normal range of motion.     Cervical back: Normal range of motion and neck supple.  Right ankle: Tenderness and swelling over the anterior talofibular ligament and posterior to lateral malleolus, no lateral or medial malleolar tenderness or proximal fifth metacarpal tenderness. No proximal fibular tenderness. 2+ pedal pulses with brisk capillary refill. Intact distal sensation and strength with normal ROM. Able to  plantar flex and dorsiflex against resistance. Able to invert and evert against resistance. Negative  dorsiflexion external rotation test. Negative squeeze test. Negative Thompson test.  Tenderness along the plantar fascia. Skin:    General: Skin is warm and dry.     Capillary Refill: Capillary refill takes less than 2 seconds.     Findings: No rash.  Neurological:     Mental Status: The patient is awake and alert.      ED Results / Procedures / Treatments   Labs (all labs ordered are listed, but only abnormal results are displayed) Labs Reviewed - No data to display   EKG     RADIOLOGY I independently reviewed and interpreted imaging and agree with radiologists findings.     PROCEDURES:  Critical Care performed:   Procedures   MEDICATIONS ORDERED IN ED: Medications - No data to display   IMPRESSION / MDM / [place] / ED COURSE  I reviewed the triage vital signs and the nursing notes.   Differential diagnosis includes, but is not limited to, fracture, sprain, contusion, tendon  injury.  Patient is awake and alert, hemodynamically stable and neurovascularly intact.  Patient has swelling and tenderness to palpation over the dorsum of the foot.  There is no evidence of fracture on x-ray.  There is no tenderness to proximal fibula that would be concerning for occult fracture.  There is no knee pain or swelling and no ligamental laxity, do not suspect knee injury.  There is no proximal fifth metatarsal tenderness concerning for Jones fracture.  Negative squeeze test so do not suspect high ankle sprain.  Negative Thompson test, do not suspect Achilles tendon rupture. Patient is able to bear weight with pain.  Patient was given ankle splint.  We discussed [place] and orthopedic follow-up.  Patient was treated symptomatically in the emergency department with improvement of symptoms.  We discussed no sports/running until ankle heals.  Patient understands and agrees with plan.  He was discharged in stable condition.   Patient's presentation is most consistent with acute complicated illness / injury requiring diagnostic workup.    FINAL CLINICAL IMPRESSION(S) / ED DIAGNOSES   Final diagnoses:  Injury of right foot, initial encounter     Rx / DC Orders   ED Discharge Orders     None        Note:  This document was prepared using Dragon voice recognition software and may include unintentional dictation errors.   Emeline Gins 06/06/22 1404    Carrie Mew, MD 06/06/22 1455

## 2022-06-06 NOTE — Discharge Instructions (Signed)
Your x-rays are normal.  Please return for any new, worsening, or change in symptoms or other concerns.  It was a pleasure caring for you today.
